# Patient Record
Sex: Female | Born: 1975 | Race: White | Hispanic: No | Marital: Married | Smoking: Never smoker
Health system: Southern US, Community
[De-identification: ages and names within clinical notes are randomized; demographics above are authoritative.]

## PROBLEM LIST (undated history)

## (undated) DIAGNOSIS — M8580 Other specified disorders of bone density and structure, unspecified site: Secondary | ICD-10-CM

## (undated) DIAGNOSIS — N200 Calculus of kidney: Secondary | ICD-10-CM

## (undated) HISTORY — DX: Calculus of kidney: N20.0

## (undated) HISTORY — PX: WISDOM TOOTH EXTRACTION: SHX21

## (undated) HISTORY — PX: LASIK: SHX215

---

## 2001-12-18 ENCOUNTER — Other Ambulatory Visit: Admission: RE | Admit: 2001-12-18 | Discharge: 2001-12-18 | Payer: Self-pay | Admitting: Obstetrics and Gynecology

## 2001-12-26 ENCOUNTER — Encounter: Admission: RE | Admit: 2001-12-26 | Discharge: 2001-12-26 | Payer: Self-pay | Admitting: Obstetrics and Gynecology

## 2001-12-26 ENCOUNTER — Encounter: Payer: Self-pay | Admitting: Obstetrics and Gynecology

## 2002-12-24 ENCOUNTER — Other Ambulatory Visit: Admission: RE | Admit: 2002-12-24 | Discharge: 2002-12-24 | Payer: Self-pay | Admitting: Obstetrics and Gynecology

## 2003-09-22 ENCOUNTER — Emergency Department (HOSPITAL_COMMUNITY): Admission: EM | Admit: 2003-09-22 | Discharge: 2003-09-22 | Payer: Self-pay | Admitting: Emergency Medicine

## 2003-12-29 ENCOUNTER — Other Ambulatory Visit: Admission: RE | Admit: 2003-12-29 | Discharge: 2003-12-29 | Payer: Self-pay | Admitting: Obstetrics and Gynecology

## 2004-01-19 ENCOUNTER — Ambulatory Visit (HOSPITAL_COMMUNITY): Admission: RE | Admit: 2004-01-19 | Discharge: 2004-01-19 | Payer: Self-pay | Admitting: Internal Medicine

## 2004-01-21 ENCOUNTER — Encounter: Admission: RE | Admit: 2004-01-21 | Discharge: 2004-01-21 | Payer: Self-pay | Admitting: Internal Medicine

## 2004-03-10 ENCOUNTER — Encounter: Admission: RE | Admit: 2004-03-10 | Discharge: 2004-03-10 | Payer: Self-pay | Admitting: Internal Medicine

## 2004-08-12 ENCOUNTER — Encounter: Admission: RE | Admit: 2004-08-12 | Discharge: 2004-08-12 | Payer: Self-pay | Admitting: Internal Medicine

## 2005-02-16 ENCOUNTER — Encounter: Admission: RE | Admit: 2005-02-16 | Discharge: 2005-02-16 | Payer: Self-pay | Admitting: Endocrinology

## 2005-10-10 ENCOUNTER — Encounter: Admission: RE | Admit: 2005-10-10 | Discharge: 2005-10-10 | Payer: Self-pay | Admitting: Internal Medicine

## 2005-12-28 ENCOUNTER — Encounter: Payer: Self-pay | Admitting: Family Medicine

## 2006-03-05 ENCOUNTER — Encounter: Admission: RE | Admit: 2006-03-05 | Discharge: 2006-03-05 | Payer: Self-pay | Admitting: Endocrinology

## 2007-03-01 ENCOUNTER — Encounter: Payer: Self-pay | Admitting: Family Medicine

## 2007-03-01 ENCOUNTER — Ambulatory Visit: Payer: Self-pay | Admitting: Family Medicine

## 2007-03-01 DIAGNOSIS — M899 Disorder of bone, unspecified: Secondary | ICD-10-CM

## 2007-03-01 DIAGNOSIS — M8580 Other specified disorders of bone density and structure, unspecified site: Secondary | ICD-10-CM | POA: Insufficient documentation

## 2007-03-01 DIAGNOSIS — M949 Disorder of cartilage, unspecified: Secondary | ICD-10-CM

## 2007-03-01 DIAGNOSIS — J309 Allergic rhinitis, unspecified: Secondary | ICD-10-CM | POA: Insufficient documentation

## 2007-03-01 DIAGNOSIS — Z87898 Personal history of other specified conditions: Secondary | ICD-10-CM | POA: Insufficient documentation

## 2007-07-19 ENCOUNTER — Encounter: Payer: Self-pay | Admitting: Family Medicine

## 2007-07-25 ENCOUNTER — Encounter: Payer: Self-pay | Admitting: Family Medicine

## 2007-07-30 ENCOUNTER — Encounter: Payer: Self-pay | Admitting: Family Medicine

## 2007-08-20 ENCOUNTER — Encounter: Payer: Self-pay | Admitting: Family Medicine

## 2007-09-09 ENCOUNTER — Encounter: Payer: Self-pay | Admitting: Family Medicine

## 2007-09-09 ENCOUNTER — Ambulatory Visit (HOSPITAL_COMMUNITY): Admission: RE | Admit: 2007-09-09 | Discharge: 2007-09-09 | Payer: Self-pay | Admitting: Specialist

## 2007-09-20 ENCOUNTER — Encounter: Payer: Self-pay | Admitting: Family Medicine

## 2008-12-26 ENCOUNTER — Inpatient Hospital Stay (HOSPITAL_COMMUNITY): Admission: AD | Admit: 2008-12-26 | Discharge: 2008-12-26 | Payer: Self-pay | Admitting: Obstetrics and Gynecology

## 2009-02-25 ENCOUNTER — Ambulatory Visit (HOSPITAL_COMMUNITY): Admission: RE | Admit: 2009-02-25 | Discharge: 2009-02-25 | Payer: Self-pay | Admitting: Obstetrics and Gynecology

## 2009-03-22 ENCOUNTER — Encounter: Payer: Self-pay | Admitting: Family Medicine

## 2009-07-23 ENCOUNTER — Inpatient Hospital Stay (HOSPITAL_COMMUNITY): Admission: AD | Admit: 2009-07-23 | Discharge: 2009-07-25 | Payer: Self-pay | Admitting: Obstetrics and Gynecology

## 2009-07-23 HISTORY — PX: OTHER SURGICAL HISTORY: SHX169

## 2009-07-29 ENCOUNTER — Ambulatory Visit: Admission: RE | Admit: 2009-07-29 | Discharge: 2009-07-29 | Payer: Self-pay | Admitting: Obstetrics and Gynecology

## 2009-08-13 ENCOUNTER — Ambulatory Visit: Admission: RE | Admit: 2009-08-13 | Discharge: 2009-08-13 | Payer: Self-pay | Admitting: Obstetrics and Gynecology

## 2010-12-18 ENCOUNTER — Encounter: Payer: Self-pay | Admitting: Obstetrics and Gynecology

## 2011-03-04 LAB — CBC
HCT: 38.6 % (ref 36.0–46.0)
Hemoglobin: 13 g/dL (ref 12.0–15.0)
RBC: 3.86 MIL/uL — ABNORMAL LOW (ref 3.87–5.11)
RDW: 13.3 % (ref 11.5–15.5)

## 2011-03-04 LAB — RPR: RPR Ser Ql: NONREACTIVE

## 2011-03-13 LAB — URINALYSIS, ROUTINE W REFLEX MICROSCOPIC
Glucose, UA: NEGATIVE mg/dL
Protein, ur: NEGATIVE mg/dL
Specific Gravity, Urine: <1.005 — ABNORMAL LOW (ref 1.005–1.030)
Urobilinogen, UA: 0.2 mg/dL (ref 0.0–1.0)

## 2011-03-24 ENCOUNTER — Other Ambulatory Visit: Payer: Self-pay | Admitting: Obstetrics and Gynecology

## 2011-03-24 ENCOUNTER — Inpatient Hospital Stay (HOSPITAL_COMMUNITY)
Admission: AD | Admit: 2011-03-24 | Discharge: 2011-03-24 | Disposition: A | Discharge status: Home / Self Care | Payer: 59 | Source: Ambulatory Visit | Attending: Obstetrics | Admitting: Obstetrics

## 2011-03-24 ENCOUNTER — Inpatient Hospital Stay (HOSPITAL_COMMUNITY): Payer: 59

## 2011-03-24 DIAGNOSIS — O039 Complete or unspecified spontaneous abortion without complication: Secondary | ICD-10-CM | POA: Insufficient documentation

## 2011-03-25 ENCOUNTER — Ambulatory Visit (HOSPITAL_COMMUNITY): Admission: RE | Admit: 2011-03-25 | Payer: 59 | Source: Ambulatory Visit | Admitting: Obstetrics and Gynecology

## 2011-03-28 DEATH — deceased

## 2011-04-07 ENCOUNTER — Encounter: Payer: Self-pay | Admitting: *Deleted

## 2011-04-11 ENCOUNTER — Ambulatory Visit (INDEPENDENT_AMBULATORY_CARE_PROVIDER_SITE_OTHER): Payer: 59 | Admitting: Family Medicine

## 2011-04-11 ENCOUNTER — Encounter: Payer: Self-pay | Admitting: Family Medicine

## 2011-04-11 DIAGNOSIS — M899 Disorder of bone, unspecified: Secondary | ICD-10-CM

## 2011-04-11 DIAGNOSIS — L719 Rosacea, unspecified: Secondary | ICD-10-CM

## 2011-04-11 DIAGNOSIS — R1900 Intra-abdominal and pelvic swelling, mass and lump, unspecified site: Secondary | ICD-10-CM

## 2011-04-11 DIAGNOSIS — Z13220 Encounter for screening for lipoid disorders: Secondary | ICD-10-CM

## 2011-04-11 DIAGNOSIS — M949 Disorder of cartilage, unspecified: Secondary | ICD-10-CM

## 2011-04-11 LAB — COMPREHENSIVE METABOLIC PANEL
ALT: 13 U/L (ref 0–35)
AST: 17 U/L (ref 0–37)
Alkaline Phosphatase: 51 U/L (ref 39–117)
BUN: 15 mg/dL (ref 6–23)
Calcium: 9.3 mg/dL (ref 8.4–10.5)
Chloride: 105 mEq/L (ref 96–112)
Creatinine, Ser: 0.6 mg/dL (ref 0.4–1.2)
Potassium: 4.7 mEq/L (ref 3.5–5.1)

## 2011-04-11 LAB — LIPID PANEL
HDL: 47.7 mg/dL (ref >39.00)
Total CHOL/HDL Ratio: 3
Triglycerides: 62 mg/dL (ref 0.0–149.0)
VLDL: 12.4 mg/dL (ref 0.0–40.0)

## 2011-04-11 MED ORDER — METRONIDAZOLE 1 % EX GEL: Freq: Every day | CUTANEOUS | Status: AC

## 2011-04-11 NOTE — Progress Notes (Signed)
  Subjective:    Patient ID: [name], female    DOB: [birth year], 35 y.o.   MRN: [id number]  HPI  35 year old female here to reestablish.  Hx/O hypercalceuria: w/up neg by endocrinologist, Dr. Lurene Shadow. 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 Was on HCTZ and calcium levels returned to normal and cleared from urine. She stopped HCTZ until done having kids. Has not seen Dr. Horald Pollen in  Has now had 20 month  Old. Not sure when last checked.  Osteopenia, stable: Last DXA done at Dr. Danella Penton office over 2 years ago.  Has noted since childbirth she has some patchy areas on face (cheecks and nose)...red, not itchy, dry and flaky. Red bumps on nose, occ slight pimple head. Wears mask at work, no new soaps etc. Mom has rosecea.   Review of Systems  Constitutional: Negative for fever, fatigue and unexpected weight change.  HENT: Negative for ear pain.   Eyes: Negative for pain.  Respiratory: Negative for shortness of breath.   Cardiovascular: Negative for chest pain, palpitations and leg swelling.  Gastrointestinal: Positive for abdominal pain. Negative for nausea, diarrhea, constipation and blood in stool.       Ever since delivery has noted swelling pressing forward in right flank..If moves this disappears. Soreness in this area. Has gradually improved over time. Discussed with GYN.Marland Kitchenminmal concerns, nml pelvic, [place] etc.       Objective:   Physical Exam  Constitutional: Vital signs are normal. She appears well-developed and well-nourished. She is cooperative.  Non-toxic appearance. She does not appear ill. No distress.  HENT:  Head: Normocephalic.  Right Ear: Hearing, tympanic membrane, external ear and ear canal normal. Tympanic membrane is not erythematous, not retracted and not bulging.  Left Ear: Hearing, tympanic membrane, external ear and ear canal normal. Tympanic membrane is not erythematous, not retracted and not bulging.  Nose: No mucosal edema or rhinorrhea. Right sinus  exhibits no maxillary sinus tenderness and no frontal sinus tenderness. Left sinus exhibits no maxillary sinus tenderness and no frontal sinus tenderness.  Mouth/Throat: Uvula is midline, oropharynx is clear and moist and mucous membranes are normal.  Eyes: Conjunctivae, EOM and lids are normal. Pupils are equal, round, and reactive to light. No foreign bodies found.  Neck: Trachea normal and normal range of motion. Neck supple. Carotid bruit is not present. No mass and no thyromegaly present.  Cardiovascular: Normal rate, regular rhythm, S1 normal, S2 normal, normal heart sounds, intact distal pulses and normal pulses.  Exam reveals no gallop and no friction rub.   No murmur heard. Pulmonary/Chest: Effort normal and breath sounds normal. Not tachypneic. No respiratory distress. She has no decreased breath sounds. She has no wheezes. She has no rhonchi. She has no rales.  Abdominal: Soft. Normal appearance and bowel sounds are normal. There is no hepatosplenomegaly. There is no tenderness. No hernia.       No masses  Neurological: She is alert.  Skin: Skin is warm, dry and intact. No rash noted.  Psychiatric: Her speech is normal and behavior is normal. Judgment and thought content normal. Her mood appears not anxious. Cognition and memory are normal. She does not exhibit a depressed mood.          Assessment & Plan:

## 2011-04-11 NOTE — Assessment & Plan Note (Signed)
Start metro gel. If not improving. Gi to Derm for reeval.

## 2011-04-11 NOTE — Assessment & Plan Note (Signed)
Pt very thin...most likely she is feeling colon push forward..notes it more prominent after she eats, then disappears with movement. No red flags, no indications for imaging. If pt develops red flags (reviewed with pt) increasing pain or notes are more often consider further eval.  Pelvic US at GYN wnl.

## 2011-04-11 NOTE — Assessment & Plan Note (Signed)
Due for re-eval. 

## 2011-04-11 NOTE — Patient Instructions (Addendum)
Check with work about when last tetanus done. Work on regular exercise an increasing water in diet. Start metro gel for facial rash. If not improving return to [facility] for further eval. We will call with lab results. If abdominal swelling becoming more painful or noted more  frequently or if weight loss, night sweats, etc....call.

## 2011-04-11 NOTE — Assessment & Plan Note (Signed)
Due for DEXA. 

## 2011-04-11 NOTE — H&P (Signed)
[name], [name] NO.:  [id number]   MEDICAL RECORD NO.:  [id number]          PATIENT TYPE:  MAT   LOCATION:  [facility]                          FACILITY:  [facility]   PHYSICIAN:  Lenoard Aden, M.D.DATE OF BIRTH:  1975/12/03   DATE OF ADMISSION:  12/26/2008  DATE OF DISCHARGE:  12/26/2008                              HISTORY & PHYSICAL   CHIEF COMPLAINT:  Nausea, vomiting at [redacted] weeks gestation.   She is a 35 year old white female G1, P0 at 8 plus weeks gestation based  on positive SPT with nausea and vomiting, on Zofran, history of  constipation with suppository use.   PAST MEDICAL HISTORY:  Remarkable for chicken pox, kidney stones, and  migraine headaches.   ALLERGIES:  SULFA and ZITHROMAX.   SOCIAL HISTORY:  She is a nonsmoker, nondrinker.  Denies domestic or  physical violence.   FAMILY HISTORY:  Diabetes, heart disease, myocardial infarction,  hypertension, lung cancer, stroke, kidney disease, COPD, and anemia.  Prenatal course otherwise uncomplicated.   PHYSICAL EXAMINATION:  GENERAL:  She is a well-developed, well-nourished  Caucasian female.  VITAL SIGNS:  Weight of 115 pounds, height 5 feet 7 inches.  HEENT:  Normal.  LUNGS:  Clear.  HEART:  Regular rhythm.  ABDOMEN:  Soft, nontender.  PELVIC:  Deferred.  EXTREMITIES:  There are no cords.  NEUROLOGIC:  Nonfocal.  SKIN:  Intact.   IMPRESSION:  Hyperemesis in pregnancy.   PLAN:  IV fluid hydration, Zofran IV, Phenergan IV, Enema as needed.   Follow up in the office as scheduled.  The patient has Zofran and  Phenergan at home.  She will be discharged to home, pending correction  of her dehydration and IV fluids.      Lenoard Aden, M.D.  Electronically Signed    RJT/MEDQ  D:  12/26/2008  T:  12/27/2008  Job:  [id number]

## 2011-04-12 ENCOUNTER — Telehealth: Payer: Self-pay | Admitting: *Deleted

## 2011-04-12 LAB — VITAMIN D 25 HYDROXY (VIT D DEFICIENCY, FRACTURES): Vit D, 25-Hydroxy: 36 ng/mL (ref 30–89)

## 2011-04-12 NOTE — Telephone Encounter (Signed)
Please enter in EMR.

## 2011-04-12 NOTE — Telephone Encounter (Signed)
Patient called to let you know that her last tetanus was done on 02-13-02.

## 2011-04-14 NOTE — Telephone Encounter (Signed)
Updated in epic.

## 2011-04-20 ENCOUNTER — Ambulatory Visit
Admission: RE | Admit: 2011-04-20 | Discharge: 2011-04-20 | Disposition: A | Discharge status: Home / Self Care | Payer: 59 | Source: Ambulatory Visit | Attending: Family Medicine | Admitting: Family Medicine

## 2011-04-20 DIAGNOSIS — M949 Disorder of cartilage, unspecified: Secondary | ICD-10-CM

## 2011-11-28 NOTE — L&D Delivery Note (Signed)
Delivery Note At 10:46 AM a viable and healthy female was delivered via Vaginal, Spontaneous Delivery (Presentation: Right Occiput Anterior).  APGAR: 9, 9; weight 6 lb 7.7 oz (2940 g).   Placenta status: Intact, Spontaneous.  Cord: 3 vessels with the following complications: None.  Cord pH: none  Anesthesia: Epidural  Episiotomy: Median Lacerations: None Suture Repair: 3.0 chromic vicryl rapide Est. Blood Loss (mL): 300  Mom to postpartum.  Baby to nursery-stable.  , A 06/21/2012, 7:49 PM

## 2011-11-30 LAB — OB RESULTS CONSOLE GC/CHLAMYDIA: Gonorrhea: NEGATIVE

## 2011-11-30 LAB — OB RESULTS CONSOLE HEPATITIS B SURFACE ANTIGEN: Hepatitis B Surface Ag: NEGATIVE

## 2011-11-30 LAB — OB RESULTS CONSOLE HIV ANTIBODY (ROUTINE TESTING): HIV: NONREACTIVE

## 2011-11-30 LAB — OB RESULTS CONSOLE ABO/RH

## 2011-11-30 LAB — OB RESULTS CONSOLE RUBELLA ANTIBODY, IGM: Rubella: IMMUNE

## 2011-11-30 LAB — OB RESULTS CONSOLE ANTIBODY SCREEN: Antibody Screen: NEGATIVE

## 2012-02-08 ENCOUNTER — Ambulatory Visit: Payer: 59

## 2012-02-08 ENCOUNTER — Other Ambulatory Visit: Payer: 59 | Admitting: Lab

## 2012-05-28 LAB — OB RESULTS CONSOLE GBS: GBS: NEGATIVE

## 2012-06-19 LAB — POCT UA - GLUCOSE/PROTEIN

## 2012-06-21 ENCOUNTER — Encounter (HOSPITAL_COMMUNITY): Payer: Self-pay | Admitting: Anesthesiology

## 2012-06-21 ENCOUNTER — Inpatient Hospital Stay (HOSPITAL_COMMUNITY)
Admission: AD | Admit: 2012-06-21 | Discharge: 2012-06-23 | DRG: 775 | Disposition: A | Discharge status: Home / Self Care | Payer: 59 | Source: Ambulatory Visit | Attending: Obstetrics and Gynecology | Admitting: Obstetrics and Gynecology

## 2012-06-21 ENCOUNTER — Inpatient Hospital Stay (HOSPITAL_COMMUNITY): Payer: 59 | Admitting: Anesthesiology

## 2012-06-21 ENCOUNTER — Encounter (HOSPITAL_COMMUNITY): Payer: Self-pay | Admitting: *Deleted

## 2012-06-21 DIAGNOSIS — O09519 Supervision of elderly primigravida, unspecified trimester: Principal | ICD-10-CM | POA: Diagnosis present

## 2012-06-21 HISTORY — DX: Hypercalcemia: E83.52

## 2012-06-21 HISTORY — DX: Other specified disorders of bone density and structure, unspecified site: M85.80

## 2012-06-21 LAB — CBC
MCH: 32.5 pg (ref 26.0–34.0)
Platelets: 166 10*3/uL (ref 150–400)
RBC: 4.06 MIL/uL (ref 3.87–5.11)

## 2012-06-21 LAB — URIC ACID: Uric Acid, Serum: 5 mg/dL (ref 2.4–7.0)

## 2012-06-21 MED ORDER — FENTANYL 2.5 MCG/ML BUPIVACAINE 1/10 % EPIDURAL INFUSION (WH - ANES)
14.0000 mL/h | INTRAMUSCULAR | Status: DC
  Administered 2012-06-21 (×2): 14 mL/h via EPIDURAL
  Filled 2012-06-21: qty 60

## 2012-06-21 MED ORDER — PHENYLEPHRINE 40 MCG/ML (10ML) SYRINGE FOR IV PUSH (FOR BLOOD PRESSURE SUPPORT): 80.0000 ug | PREFILLED_SYRINGE | INTRAVENOUS | Status: DC | PRN

## 2012-06-21 MED ORDER — IBUPROFEN 600 MG PO TABS
600.0000 mg | ORAL_TABLET | Freq: Four times a day (QID) | ORAL | Status: DC
  Administered 2012-06-21 – 2012-06-23 (×8): 600 mg via ORAL
  Filled 2012-06-21 (×8): qty 1

## 2012-06-21 MED ORDER — CITRIC ACID-SODIUM CITRATE 334-500 MG/5ML PO SOLN: 30.0000 mL | ORAL | Status: DC | PRN

## 2012-06-21 MED ORDER — DIBUCAINE 1 % RE OINT: 1.0000 "application " | TOPICAL_OINTMENT | RECTAL | Status: DC | PRN

## 2012-06-21 MED ORDER — SENNOSIDES-DOCUSATE SODIUM 8.6-50 MG PO TABS
2.0000 | ORAL_TABLET | Freq: Every day | ORAL | Status: DC
  Administered 2012-06-21 – 2012-06-22 (×2): 2 via ORAL

## 2012-06-21 MED ORDER — DIPHENHYDRAMINE HCL 25 MG PO CAPS: 25.0000 mg | ORAL_CAPSULE | Freq: Four times a day (QID) | ORAL | Status: DC | PRN

## 2012-06-21 MED ORDER — ONDANSETRON HCL 4 MG PO TABS: 4.0000 mg | ORAL_TABLET | ORAL | Status: DC | PRN

## 2012-06-21 MED ORDER — LACTATED RINGERS IV SOLN: INTRAVENOUS | Status: DC

## 2012-06-21 MED ORDER — EPHEDRINE 5 MG/ML INJ
10.0000 mg | INTRAVENOUS | Status: DC | PRN
  Filled 2012-06-21: qty 4

## 2012-06-21 MED ORDER — OXYCODONE-ACETAMINOPHEN 5-325 MG PO TABS: 1.0000 | ORAL_TABLET | ORAL | Status: DC | PRN

## 2012-06-21 MED ORDER — IBUPROFEN 600 MG PO TABS: 600.0000 mg | ORAL_TABLET | Freq: Four times a day (QID) | ORAL | Status: DC | PRN

## 2012-06-21 MED ORDER — LACTATED RINGERS IV SOLN: 500.0000 mL | Freq: Once | INTRAVENOUS | Status: DC

## 2012-06-21 MED ORDER — WITCH HAZEL-GLYCERIN EX PADS: 1.0000 "application " | MEDICATED_PAD | CUTANEOUS | Status: DC | PRN

## 2012-06-21 MED ORDER — SIMETHICONE 80 MG PO CHEW: 80.0000 mg | CHEWABLE_TABLET | ORAL | Status: DC | PRN

## 2012-06-21 MED ORDER — ACETAMINOPHEN 325 MG PO TABS: 650.0000 mg | ORAL_TABLET | ORAL | Status: DC | PRN

## 2012-06-21 MED ORDER — LACTATED RINGERS IV SOLN: 500.0000 mL | INTRAVENOUS | Status: DC | PRN

## 2012-06-21 MED ORDER — ONDANSETRON HCL 4 MG/2ML IJ SOLN: 4.0000 mg | Freq: Four times a day (QID) | INTRAMUSCULAR | Status: DC | PRN

## 2012-06-21 MED ORDER — EPHEDRINE 5 MG/ML INJ: 10.0000 mg | INTRAVENOUS | Status: DC | PRN

## 2012-06-21 MED ORDER — LIDOCAINE HCL (PF) 1 % IJ SOLN
30.0000 mL | INTRAMUSCULAR | Status: DC | PRN
  Filled 2012-06-21: qty 30

## 2012-06-21 MED ORDER — OXYTOCIN BOLUS FROM INFUSION
250.0000 mL | Freq: Once | INTRAVENOUS | Status: AC
  Administered 2012-06-21: 250 mL via INTRAVENOUS
  Filled 2012-06-21: qty 500

## 2012-06-21 MED ORDER — ZOLPIDEM TARTRATE 5 MG PO TABS: 5.0000 mg | ORAL_TABLET | Freq: Every evening | ORAL | Status: DC | PRN

## 2012-06-21 MED ORDER — TERBUTALINE SULFATE 1 MG/ML IJ SOLN: 0.2500 mg | Freq: Once | INTRAMUSCULAR | Status: DC | PRN

## 2012-06-21 MED ORDER — ONDANSETRON HCL 4 MG/2ML IJ SOLN: 4.0000 mg | INTRAMUSCULAR | Status: DC | PRN

## 2012-06-21 MED ORDER — LIDOCAINE HCL (PF) 1 % IJ SOLN
INTRAMUSCULAR | Status: DC | PRN
  Administered 2012-06-21 (×3): 4 mL

## 2012-06-21 MED ORDER — OXYTOCIN 40 UNITS IN LACTATED RINGERS INFUSION - SIMPLE MED
1.0000 m[IU]/min | INTRAVENOUS | Status: DC
  Filled 2012-06-21: qty 1000

## 2012-06-21 MED ORDER — PRENATAL MULTIVITAMIN CH
1.0000 | ORAL_TABLET | Freq: Every day | ORAL | Status: DC
  Filled 2012-06-21 (×2): qty 1

## 2012-06-21 MED ORDER — LANOLIN HYDROUS EX OINT: TOPICAL_OINTMENT | CUTANEOUS | Status: DC | PRN

## 2012-06-21 MED ORDER — DIPHENHYDRAMINE HCL 50 MG/ML IJ SOLN: 12.5000 mg | INTRAMUSCULAR | Status: DC | PRN

## 2012-06-21 MED ORDER — BENZOCAINE-MENTHOL 20-0.5 % EX AERO
1.0000 "application " | INHALATION_SPRAY | CUTANEOUS | Status: DC | PRN
  Filled 2012-06-21: qty 56

## 2012-06-21 MED ORDER — PHENYLEPHRINE 40 MCG/ML (10ML) SYRINGE FOR IV PUSH (FOR BLOOD PRESSURE SUPPORT)
80.0000 ug | PREFILLED_SYRINGE | INTRAVENOUS | Status: DC | PRN
  Filled 2012-06-21: qty 5

## 2012-06-21 MED ORDER — OXYTOCIN 10 UNIT/ML IJ SOLN: 10.0000 [IU] | Freq: Once | INTRAMUSCULAR | Status: DC

## 2012-06-21 MED ORDER — OXYTOCIN 40 UNITS IN LACTATED RINGERS INFUSION - SIMPLE MED: 62.5000 mL/h | Freq: Once | INTRAVENOUS | Status: DC

## 2012-06-21 NOTE — MAU Note (Signed)
Pt states U/C's started about 0520 this morning and have become more intense and closer together.  Denies any vaginal bleeding or ROM.  Good fetal movement.

## 2012-06-21 NOTE — Anesthesia Postprocedure Evaluation (Signed)
  Anesthesia Post-op Note  Patient: [name]  Procedure(s) Performed: * No procedures listed *  Patient Location: Mother/Baby  Anesthesia Type: Epidural  Level of Consciousness: awake  Airway and Oxygen Therapy: Patient Spontanous Breathing  Post-op Pain: mild  Post-op Assessment: Patient's Cardiovascular Status Stable and Respiratory Function Stable  Post-op Vital Signs: stable  Complications: No apparent anesthesia complications

## 2012-06-21 NOTE — Anesthesia Preprocedure Evaluation (Signed)
Anesthesia Evaluation  Patient identified by MRN, date of birth, ID band Patient awake    Reviewed: Allergy & Precautions, H&P , NPO status , Patient's Chart, lab work & pertinent test results, reviewed documented beta blocker date and time   History of Anesthesia Complications Negative for: history of anesthetic complications  Airway Mallampati: I TM Distance: >3 FB Neck ROM: full    Dental  (+) Teeth Intact   Pulmonary Recent URI  (currently has a cold),  breath sounds clear to auscultation        Cardiovascular negative cardio ROS  Rhythm:regular Rate:Normal     Neuro/Psych negative neurological ROS  negative psych ROS   GI/Hepatic negative GI ROS, Neg liver ROS,   Endo/Other  negative endocrine ROS  Renal/GU negative Renal ROS     Musculoskeletal   Abdominal   Peds  Hematology negative hematology ROS (+)   Anesthesia Other Findings   Reproductive/Obstetrics (+) Pregnancy                           Anesthesia Physical Anesthesia Plan  ASA: II  Anesthesia Plan: Epidural   Post-op Pain Management:    Induction:   Airway Management Planned:   Additional Equipment:   Intra-op Plan:   Post-operative Plan:   Informed Consent: I have reviewed the patients History and Physical, chart, labs and discussed the procedure including the risks, benefits and alternatives for the proposed anesthesia with the patient or authorized representative who has indicated his/her understanding and acceptance.     Plan Discussed with:   Anesthesia Plan Comments:         Anesthesia Quick Evaluation

## 2012-06-21 NOTE — H&P (Signed)
[name] is a 36 y.o. female  [id number] MWF now @ 38 weeks presenting for active labor. Intact membrane Maternal Medical History:  Reason for admission: Reason for admission: contractions.  Contractions: Onset was 3-5 hours ago.   Frequency: regular.   Perceived severity is moderate.    Fetal activity: Perceived fetal activity is normal.    Prenatal complications: no prenatal complications Prenatal Complications - Diabetes: none.    OB History    Grav Para Term Preterm Abortions TAB SAB Ect Mult Living   1             NSVD x 1 No past medical history on file. Past Surgical History  Procedure Date  . Lasik   . Nsvd  07/23/2009   Family History: family history includes Cancer in her maternal grandfather; Diabetes in her maternal grandmother and maternal uncle; Heart attack in her father; Kidney disease in her maternal grandmother and maternal uncle; and Osteoporosis in her paternal grandmother. Social History:  reports that she has never smoked. She does not have any smokeless tobacco history on file. She reports that she does not drink alcohol or use illicit drugs.   Prenatal Transfer Tool  Maternal Diabetes: No Genetic Screening: Declined Maternal Ultrasounds/Referrals: Normal Fetal Ultrasounds or other Referrals:  None Maternal Substance Abuse:  No Significant Maternal Medications:  None Significant Maternal Lab Results:  Lab values include: Group B Strep negative Other Comments:  None  ROS  Dilation: 4.5 Effacement (%): 80 Station: -1 Exam by:: Dr. Cherly Hensen There were no vitals taken for this visit. Maternal Exam:  Uterine Assessment: Contraction strength is moderate.  Contraction frequency is regular.   Abdomen: Patient reports no abdominal tenderness. Fetal presentation: vertex  Introitus: Ferning test: not done.   Pelvis: adequate for delivery.   Cervix: Cervix evaluated by digital exam.     Physical Exam  Constitutional: She is oriented to person,  place, and time. She appears well-developed and well-nourished.  HENT:  Head: Atraumatic.  Eyes: Pupils are equal, round, and reactive to light.  Neck: Normal range of motion.  Cardiovascular: Regular rhythm.   Respiratory: Breath sounds normal.  GI: Soft.  Musculoskeletal: She exhibits no edema.  Neurological: She is alert and oriented to person, place, and time.  Skin: Skin is warm and dry.  Psychiatric: She has a normal mood and affect.   VE: 4/80/-2 intact  Prenatal labs: ABO, Rh:  A positive Antibody:  neg Rubella:  Immune RPR:   NR HBsAg:   neg HIV:   neg GBS:   neg  Assessment/Plan: Term gestation  Active labor  P)admit  Routine labs. Epidural,. Pitocin prn , A 06/21/2012, 8:24 AM

## 2012-06-21 NOTE — Anesthesia Procedure Notes (Signed)
Epidural Patient location during procedure: OB Start time: 06/21/2012 9:49 AM Reason for block: procedure for pain  Staffing Performed by: anesthesiologist   Preanesthetic Checklist Completed: patient identified, site marked, surgical consent, pre-op evaluation, timeout performed, IV checked, risks and benefits discussed and monitors and equipment checked  Epidural Patient position: sitting Prep: site prepped and draped and DuraPrep Patient monitoring: continuous pulse ox and blood pressure Approach: midline Injection technique: LOR air  Needle:  Needle type: Tuohy  Needle gauge: 17 G Needle length: 9 cm Catheter type: closed end flexible Catheter size: 19 Gauge Test dose: negative  Assessment Events: blood not aspirated, injection not painful, no injection resistance, negative IV test and no paresthesia  Additional Notes Discussed risk of headache, infection, bleeding, nerve injury and failed or incomplete block.  Patient voices understanding and wishes to proceed.

## 2012-06-22 ENCOUNTER — Encounter (HOSPITAL_COMMUNITY): Payer: Self-pay

## 2012-06-22 LAB — CBC
HCT: 33.1 % — ABNORMAL LOW (ref 36.0–46.0)
Hemoglobin: 11.2 g/dL — ABNORMAL LOW (ref 12.0–15.0)
RBC: 3.43 MIL/uL — ABNORMAL LOW (ref 3.87–5.11)

## 2012-06-22 LAB — RPR: RPR Ser Ql: NONREACTIVE

## 2012-06-22 NOTE — Progress Notes (Signed)
PPD 1 SVD  S:  Reports feeling well.             Tolerating po/ No nausea or vomiting             Bleeding is light             Pain controlled with Motrin.             Up ad lib / ambulatory / voiding well.   Newborn  Information for the patient's newborn:  Kindall, Swaby [[id number]]  female  breast feeding, previous issues w/ supply, discussed galactogogue - Moringa  / Circumcision done   O:  A & O x 3 NAD             VS:  Filed Vitals:   06/21/12 1305 06/21/12 1410 06/21/12 1810 06/22/12 0200  BP: 101/67 104/67 109/64 116/69  Pulse: 76 106 95 76  Temp: 98.2 F (36.8 C) 98.8 F (37.1 C) 98.2 F (36.8 C) 98 F (36.7 C)  TempSrc: Oral Oral Oral Oral  Resp: 16 16 18 18   Height:      Weight:      SpO2:        LABS:  Basename 06/22/12 0500 06/21/12 0915  WBC 8.6 10.9*  HGB 11.2* 13.2  HCT 33.1* 39.1  PLT 143* 166    Blood type: A/Positive/-- (01/03 0000)  Rubella: Immune (01/03 0000)   I&O: I/O last 3 completed shifts: In: -  Out: 600 [Blood:600]      Lungs: Clear and unlabored  Heart: regular rate and rhythm / no murmurs  Abdomen: soft, non-tender, non-distended              Fundus: firm, non-tender, U -3  Perineum: intact, no edema  Lochia: scant  Extremities: no edema, no calf pain or tenderness, neg Homans    A/P: PPD # 1 36 y.o., [id number]    Principal Problem:  *Postpartum care following vaginal delivery (7/26)   Doing well - stable status  LC support today  Routine post partum orders  Anticipate discharge home in AM.   PAUL,DANIELA, CNM, MSN 06/22/2012, 10:01 AM

## 2012-06-23 MED ORDER — IBUPROFEN 600 MG PO TABS: 600.0000 mg | ORAL_TABLET | Freq: Four times a day (QID) | ORAL | Status: AC

## 2012-06-23 NOTE — Discharge Summary (Signed)
Obstetric Discharge Summary Reason for Admission: onset of labor Prenatal Procedures: ultrasound Intrapartum Procedures: spontaneous vaginal delivery Postpartum Procedures: none Complications-Operative and Postpartum: none Hemoglobin  Date Value Range Status  06/22/2012 11.2* 12.0 - 15.0 g/dL Final     HCT  Date Value Range Status  06/22/2012 33.1* 36.0 - 46.0 % Final    Physical Exam:  General: alert, cooperative and no distress Lochia: appropriate Uterine Fundus: firm Incision: n/a, perineum intact DVT Evaluation: Negative Homan's sign. No significant calf/ankle edema.  Discharge Diagnoses: Term Pregnancy-delivered  Discharge Information: Date: 06/23/2012 Activity: pelvic rest Diet: routine Medications: PNV and Ibuprofen Condition: stable Instructions: refer to practice specific booklet Discharge to: home Follow-up Information    Follow up with COUSINS,SHERONETTE A, MD. Schedule an appointment as soon as possible for a visit in 6 weeks.   Contact information:   [address] [place] [address] [phone]          Newborn Data: Live born female "Montez Morita" Birth Weight: 6 lb 7.7 oz (2940 g) APGAR: 9, 9  Home with mother.  PAUL,DANIELA 06/23/2012, 9:53 AM

## 2012-06-23 NOTE — Progress Notes (Signed)
Post Partum Day #2            Information for the patient's newborn:  Aanya, Haynes [[id number]]  female   / circumcision done Feeding: breast, baby doing well on breast, milk coming in.  Subjective: No HA, SOB, CP, F/C, breast symptoms. Pain minimal. Normal vaginal bleeding, no clots.      Objective:  Temp:  [98 F (36.7 C)-98.6 F (37 C)] 98 F (36.7 C) (07/28 0600) Pulse Rate:  [86-96] 89  (07/28 0600) Resp:  [18] 18  (07/28 0600) BP: (95-109)/(66-67) 95/67 mmHg (07/28 0600)  No intake or output data in the 24 hours ending 06/23/12 0944     Basename 06/22/12 0500 06/21/12 0915  WBC 8.6 10.9*  HGB 11.2* 13.2  HCT 33.1* 39.1  PLT 143* 166    Blood type: A/Positive/-- (01/03 0000) Rubella: Immune (01/03 0000)    Physical Exam:  General: alert, cooperative and no distress Uterine Fundus: firm Lochia: appropriate Perineum: intact, edema none DVT Evaluation: Negative Homan's sign. No significant calf/ankle edema.    Assessment/Plan: PPD # 2 / 36 y.o., [id number]   Principal Problem:  *Postpartum care following vaginal delivery (7/26)    normal postpartum exam  Continue current postpartum care  D/C home   LOS: 2 days   ,, CNM, MSN 06/23/2012, 9:44 AM

## 2012-06-24 ENCOUNTER — Encounter (HOSPITAL_COMMUNITY): Payer: Self-pay | Admitting: *Deleted

## 2012-08-02 ENCOUNTER — Encounter (HOSPITAL_BASED_OUTPATIENT_CLINIC_OR_DEPARTMENT_OTHER): Payer: Self-pay | Admitting: *Deleted

## 2012-08-02 ENCOUNTER — Emergency Department (HOSPITAL_BASED_OUTPATIENT_CLINIC_OR_DEPARTMENT_OTHER)
Admission: EM | Admit: 2012-08-02 | Discharge: 2012-08-02 | Disposition: A | Discharge status: Home / Self Care | Payer: 59 | Attending: Emergency Medicine | Admitting: Emergency Medicine

## 2012-08-02 DIAGNOSIS — R109 Unspecified abdominal pain: Secondary | ICD-10-CM | POA: Insufficient documentation

## 2012-08-02 DIAGNOSIS — Z87442 Personal history of urinary calculi: Secondary | ICD-10-CM | POA: Insufficient documentation

## 2012-08-02 DIAGNOSIS — N23 Unspecified renal colic: Secondary | ICD-10-CM

## 2012-08-02 DIAGNOSIS — Z882 Allergy status to sulfonamides status: Secondary | ICD-10-CM | POA: Insufficient documentation

## 2012-08-02 DIAGNOSIS — R319 Hematuria, unspecified: Secondary | ICD-10-CM | POA: Insufficient documentation

## 2012-08-02 LAB — URINE MICROSCOPIC-ADD ON

## 2012-08-02 LAB — URINALYSIS, ROUTINE W REFLEX MICROSCOPIC
Glucose, UA: NEGATIVE mg/dL
Leukocytes, UA: NEGATIVE
pH: 6 (ref 5.0–8.0)

## 2012-08-02 NOTE — ED Notes (Signed)
Pt reports relief from left flank pain upon urination.  There is a visible stone in her urine.

## 2012-08-02 NOTE — ED Provider Notes (Signed)
History     CSN: [id number]  Arrival date & time 08/02/12  5784   First MD Initiated Contact with Patient 08/02/12 0710      Chief Complaint  Patient presents with  . Flank Pain  . Urinary Frequency    (Consider location/radiation/quality/duration/timing/severity/associated sxs/prior treatment) HPI [name] is a 36 y.o. female presenting with "kidney stone."  Patient started having symptoms a few days ago, and was able to see her obstetrician gynecologist yesterday the touch and a bladder infection and was started on Keflex. Patient's pain was all low and seemed like "bladder spasms", however she was awoken this morning by severe left-sided 10/10 deep aching in the left flank. This is also associated with some urgency, frequency dysuria.  Patient felt hot last night but feels better now. She thinks she is passed whatever stone she had. 6 weeks postpartum and is breast-feeding. She's not been sexually active with her husband. The patient denies any vaginal discharge, vaginal odor, breast pain or redness, denies any abdominal pain. Patient is pain-free at this time.   Past Medical History  Diagnosis Date  . Osteopenia   . Hypercalcemia   . Postpartum care following vaginal delivery (7/26) 06/22/2012    Past Surgical History  Procedure Date  . Lasik   . Nsvd  07/23/2009  . Wisdom tooth extraction     Family History  Problem Relation Age of Onset  . Heart attack Father   . Diabetes Maternal Grandmother   . Kidney disease Maternal Grandmother   . Cancer Maternal Grandfather     lung  . Osteoporosis Paternal Grandmother   . Diabetes Maternal Uncle   . Kidney disease Maternal Uncle     History  Substance Use Topics  . Smoking status: Never Smoker   . Smokeless tobacco: Never Used  . Alcohol Use: No    OB History    Grav Para Term Preterm Abortions TAB SAB Ect Mult Living   3 2 2  1  1   2       Review of Systems Feeling hot, chills, urgency, frequency, flank pain  ;patient denies any fevers or chills, changes in vision, earache, sore throat, neck pain or stiffness, chest pain or pressure, palpitations, syncope, dyspnea, cough, wheezing, nausea, vomiting, diarrhea, melena, red bloody stools, myalgias, arthralgias, recent trauma, rash, itching, skin lesions, easy bruising or bleeding, headache, seizures, numbness, tingling or weakness.    Allergies  Azithromycin and Sulfa antibiotics  Home Medications   Current Outpatient Rx  Name Route Sig Dispense Refill  . DOCUSATE SODIUM 100 MG PO CAPS Oral Take 100 mg by mouth daily as needed. Stool softener.    . GUAIFENESIN 100 MG/5ML PO LIQD Oral Take 200 mg by mouth 3 (three) times daily as needed. For congestion.    Marland Kitchen LORATADINE 10 MG PO TABS Oral Take 10 mg by mouth daily as needed. For allergy.    . MULTI-VITAMIN/MINERALS PO TABS Oral Take 1 tablet by mouth daily.      BP 123/77  Pulse 86  Temp 97.9 F (36.6 C) (Oral)  Resp 16  Ht 5\' 6"  (1.676 m)  Wt 125 lb (56.7 kg)  BMI 20.18 kg/m2  SpO2 100%  Breastfeeding? Unknown  Physical Exam VITAL SIGNS:   Filed Vitals:   08/02/12 0654  BP: 123/77  Pulse: 86  Temp: 97.9 F (36.6 C)  Resp: 16   CONSTITUTIONAL: Awake, oriented, appears non-toxic HENT: Atraumatic, normocephalic, oral mucosa pink and moist, airway patent. Nares  patent without drainage. External ears normal. EYES: Conjunctiva clear, EOMI, PERRLA NECK: Trachea midline, non-tender, supple CARDIOVASCULAR: Normal heart rate, Normal rhythm, No murmurs, rubs, gallops Breast: Deferred - patient denies any redness or tenderness PULMONARY/CHEST: Clear to auscultation, no rhonchi, wheezes, or rales. Symmetrical breath sounds. Non-tender. No CVA tenderness to percussion. ABDOMINAL: Non-distended, soft, non-tender - no rebound or guarding.  BS normal. NEUROLOGIC: Non-focal, moving all four extremities, no gross sensory or motor deficits. EXTREMITIES: No clubbing, cyanosis, or edema SKIN: Warm,  Dry, No erythema, No rash  ED Course  Procedures (including critical care time)   Labs Reviewed  URINALYSIS, ROUTINE W REFLEX MICROSCOPIC   No results found.   No diagnosis found.    MDM  NELEH MULDOON is a 36 y.o. female Presenting with likely renal colic and possible passed kidney stone. Patient saw her obstetrician gynecologist yesterday, was put on Keflex and urine was cultured. I do not have access to that laboratory data however the patient says her symptoms have just about resolved. Urinalysis obtained here shows red cells consistent with ureteral colic, and 0-2 white cells. Urinalysis is certainly not consistent with pyelonephritis even after a few doses of Keflex.  Patient has no CVA tenderness on clinical exam. Discussed risks and benefits with starting a different antibiotic, third-generation cephalosporin, but the patient is willing to continue taking the Keflex and to return to the emergency department or call her obstetrician if symptoms return. Patient is a Teacher, early years/pre, she is a reliable historian and I believe a reliable patient and she will followup as needed.  This time my suspicion for pyelonephritis low. She's likely passed a stone.  We'll discharge patient home stable and in good condition.          Jones Skene, MD 08/02/12 [phone]

## 2012-08-02 NOTE — ED Notes (Signed)
Pt c/o left flank pain this am which has now subsided. Pt was seen at [facility] yesterday and dx'd with bladder infection and started on Keflex. Pt states that she continues to have urinary frequency and bladder spasms.

## 2013-11-26 ENCOUNTER — Other Ambulatory Visit: Payer: Self-pay | Admitting: Obstetrics and Gynecology

## 2013-11-26 DIAGNOSIS — N6459 Other signs and symptoms in breast: Secondary | ICD-10-CM

## 2013-11-28 ENCOUNTER — Other Ambulatory Visit: Payer: Self-pay | Admitting: Obstetrics and Gynecology

## 2013-11-28 DIAGNOSIS — N6459 Other signs and symptoms in breast: Secondary | ICD-10-CM

## 2013-12-05 ENCOUNTER — Ambulatory Visit
Admission: RE | Admit: 2013-12-05 | Discharge: 2013-12-05 | Disposition: A | Discharge status: Home / Self Care | Payer: 59 | Source: Ambulatory Visit | Attending: Obstetrics and Gynecology | Admitting: Obstetrics and Gynecology

## 2013-12-05 DIAGNOSIS — N6459 Other signs and symptoms in breast: Secondary | ICD-10-CM

## 2013-12-11 ENCOUNTER — Ambulatory Visit (INDEPENDENT_AMBULATORY_CARE_PROVIDER_SITE_OTHER): Payer: 59 | Admitting: Family Medicine

## 2013-12-11 ENCOUNTER — Encounter: Payer: Self-pay | Admitting: Family Medicine

## 2013-12-11 ENCOUNTER — Other Ambulatory Visit (HOSPITAL_COMMUNITY): Payer: Self-pay | Admitting: *Deleted

## 2013-12-11 ENCOUNTER — Telehealth: Payer: Self-pay | Admitting: Family Medicine

## 2013-12-11 VITALS — BP 110/80 | HR 122 | Temp 98.5°F | Ht 67.0 in | Wt 119.2 lb

## 2013-12-11 DIAGNOSIS — R0789 Other chest pain: Secondary | ICD-10-CM | POA: Insufficient documentation

## 2013-12-11 DIAGNOSIS — F411 Generalized anxiety disorder: Secondary | ICD-10-CM | POA: Insufficient documentation

## 2013-12-11 DIAGNOSIS — R079 Chest pain, unspecified: Secondary | ICD-10-CM

## 2013-12-11 NOTE — Assessment & Plan Note (Signed)
Eval TSH to make sure not cause of tachy and anxiety.  Definitely this could be contributing to symptoms.

## 2013-12-11 NOTE — Telephone Encounter (Signed)
Noted and agree. 

## 2013-12-11 NOTE — Telephone Encounter (Signed)
Noted  

## 2013-12-11 NOTE — Patient Instructions (Signed)
Stop at front desk to set up [place] and got to lab for further eval.  Stat ibuprofen 800 mg every 8 hours with food.  Call if symptoms worsening or if shortness of breath.  GO to ER if severe chest pain and SOB.

## 2013-12-11 NOTE — Progress Notes (Signed)
   Subjective:    Patient ID: [name], female    DOB: [birth year], 38 y.o.   MRN: [id number]  HPI  38 year old female presents with new onset  Chest pain  Off and on x several months.  Noted first several months ago... First episode when she moves her head.  She notes when she picks up son or turns head and leans back.   Left suprasternal pain.   o change with meals. No associated wth syncope or dizziness.    She now notes 4-5 times a day. Lasts seconds.  She tried omeprazole 20 mg daily, occ pepcid, tunms.. Helped minimmally  No exertional pain, no change with breathing/deep breaths.  No fatigue. No SOB., no wheeze. Had bronchitis (cough congestion, fever) in 10/2013.Marland Kitchen Took 5 days of amoxicillin.   She has not noted heart racing at home... She tends to be anxious. She has been struggling with anxiety. No depression. She has a 32 month old. She works as a Software engineer at [employer], very stressful lately. She denies insomnia.  Nml mammogram last week and  nml labs in last month.  She has osteopenia.. Concerned about rib fracture.    Review of Systems  Constitutional: Negative for fever and fatigue.  HENT: Negative for ear pain.   Eyes: Negative for pain.  Respiratory: Negative for chest tightness and shortness of breath.   Cardiovascular: Positive for chest pain. Negative for palpitations and leg swelling.  Gastrointestinal: Negative for abdominal pain.  Genitourinary: Negative for dysuria.       Objective:   Physical Exam  Constitutional: Vital signs are normal. She appears well-developed and well-nourished. She is cooperative.  Non-toxic appearance. She does not appear ill. No distress.  HENT:  Head: Normocephalic.  Right Ear: Hearing, tympanic membrane, external ear and ear canal normal. Tympanic membrane is not erythematous, not retracted and not bulging.  Left Ear: Hearing, tympanic membrane, external ear and ear canal normal. Tympanic membrane is not  erythematous, not retracted and not bulging.  Nose: No mucosal edema or rhinorrhea. Right sinus exhibits no maxillary sinus tenderness and no frontal sinus tenderness. Left sinus exhibits no maxillary sinus tenderness and no frontal sinus tenderness.  Mouth/Throat: Uvula is midline, oropharynx is clear and moist and mucous membranes are normal.  Eyes: Conjunctivae, EOM and lids are normal. Pupils are equal, round, and reactive to light. Lids are everted and swept, no foreign bodies found.  Neck: Trachea normal and normal range of motion. Neck supple. Carotid bruit is not present. No mass and no thyromegaly present.  Cardiovascular: Regular rhythm, S1 normal, S2 normal, normal heart sounds, intact distal pulses and normal pulses.  Tachycardia present.  Exam reveals no gallop, no distant heart sounds and no friction rub.   No murmur heard. Pulmonary/Chest: Effort normal and breath sounds normal. Not tachypneic. No respiratory distress. She has no decreased breath sounds. She has no wheezes. She has no rhonchi. She has no rales.  Abdominal: Soft. Normal appearance and bowel sounds are normal. There is no tenderness.  Neurological: She is alert.  Skin: Skin is warm, dry and intact. No rash noted.  Psychiatric: Her speech is normal and behavior is normal. Judgment and thought content normal. Her mood appears not anxious. Cognition and memory are normal. She does not exhibit a depressed mood.          Assessment & Plan:

## 2013-12-11 NOTE — Assessment & Plan Note (Addendum)
Not typical of chest pain, more consistent with anxiety or costochondritis but pt with tachycardia.  EKG was abnormal showing.. ? Axis deviation and ? Atrial enlargement and tachycardia.  No previous for comparison. No ST changes  No clear sign of pericarditis with rub etc.  Will eval with ECHO.  Will eval with labs.. Cbc TSH, CMET.

## 2013-12-11 NOTE — Progress Notes (Signed)
Pre-visit discussion using our clinic review tool. No additional management support is needed unless otherwise documented below in the visit note.  

## 2013-12-11 NOTE — Telephone Encounter (Signed)
Patient Information:  Caller Name: [name]  Phone: [phone]  Patient: Cindy, Robinson  Gender: Female  DOB: Mar 18, 1976  Age: 38 Years  PCP: Eliezer Lofts (Family Practice)  Pregnant: No  Office Follow Up:  Does the office need to follow up with this patient?: No  Instructions For The Office: N/A  RN Note:  Declines to come to office now due chronicity of intermittent chest pain with movement and work schedule.  Requested to be seen after 1400.  Symptoms  Reason For Call & Symptoms: Intermittent, dull chest discomfort/pain in mid to left sternal area for past 2 months.  Pain occurs only with movement, ie: lifiting kids, or deep breath.  Reviewed Health History In EMR: Yes  Reviewed Medications In EMR: Yes  Reviewed Allergies In EMR: Yes  Reviewed Surgeries / Procedures: Yes  Date of Onset of Symptoms: 10/11/2013  Treatments Tried: Proton pump inhibitor  Treatments Tried Worked: No OB / GYN:  LMP: 11/24/2013  Guideline(s) Used:  Chest Pain  Disposition Per Guideline:   Go to ED Now (or to Office with PCP Approval)  Reason For Disposition Reached:   Intermittent chest pain and pain has been increasing in severity or frequency  Advice Given:  Fleeting Chest Pain:  Fleeting chest pains that last only a few seconds and then go away are generally not serious. They may be from pinched muscles or nerves in your chest wall.  Expected Course:  These mild chest pains usually disappear within 3 days.  Call Back If:  Severe chest pain  Constant chest pain lasting longer than 5 minutes  Difficulty breathing  Fever  You become worse.  RN Overrode Recommendation:  Make Appointment  Refused to be seen immediately due to chronicity of intermittent chest pain.  Requests appointment after work at 1400.  Appointment Scheduled:  12/11/2013 15:15:00 Appointment Scheduled Provider:  Eliezer Lofts [facility])

## 2013-12-12 ENCOUNTER — Other Ambulatory Visit (INDEPENDENT_AMBULATORY_CARE_PROVIDER_SITE_OTHER): Payer: 59

## 2013-12-12 ENCOUNTER — Telehealth: Payer: Self-pay | Admitting: Family Medicine

## 2013-12-12 ENCOUNTER — Other Ambulatory Visit: Payer: Self-pay

## 2013-12-12 DIAGNOSIS — R079 Chest pain, unspecified: Secondary | ICD-10-CM

## 2013-12-12 DIAGNOSIS — R000 Tachycardia, unspecified: Secondary | ICD-10-CM

## 2013-12-12 DIAGNOSIS — R0789 Other chest pain: Secondary | ICD-10-CM

## 2013-12-12 LAB — CBC WITH DIFFERENTIAL/PLATELET
BASOS PCT: 0.4 % (ref 0.0–3.0)
Basophils Absolute: 0 10*3/uL (ref 0.0–0.1)
EOS PCT: 0.1 % (ref 0.0–5.0)
Eosinophils Absolute: 0 10*3/uL (ref 0.0–0.7)
HEMATOCRIT: 40.8 % (ref 36.0–46.0)
HEMOGLOBIN: 13.9 g/dL (ref 12.0–15.0)
Lymphocytes Relative: 12.9 % (ref 12.0–46.0)
Lymphs Abs: 0.9 10*3/uL (ref 0.7–4.0)
MCHC: 34 g/dL (ref 30.0–36.0)
MCV: 92.5 fl (ref 78.0–100.0)
MONOS PCT: 3.4 % (ref 3.0–12.0)
Monocytes Absolute: 0.2 10*3/uL (ref 0.1–1.0)
Neutro Abs: 6 10*3/uL (ref 1.4–7.7)
Neutrophils Relative %: 83.2 % — ABNORMAL HIGH (ref 43.0–77.0)
Platelets: 235 10*3/uL (ref 150.0–400.0)
RBC: 4.41 Mil/uL (ref 3.87–5.11)
RDW: 13.8 % (ref 11.5–14.6)
WBC: 7.2 10*3/uL (ref 4.5–10.5)

## 2013-12-12 LAB — COMPREHENSIVE METABOLIC PANEL
ALT: 12 U/L (ref 0–35)
AST: 18 U/L (ref 0–37)
Albumin: 4.6 g/dL (ref 3.5–5.2)
Alkaline Phosphatase: 58 U/L (ref 39–117)
BILIRUBIN TOTAL: 0.8 mg/dL (ref 0.3–1.2)
BUN: 12 mg/dL (ref 6–23)
CO2: 25 meq/L (ref 19–32)
CREATININE: 0.7 mg/dL (ref 0.4–1.2)
Calcium: 9.4 mg/dL (ref 8.4–10.5)
Chloride: 106 mEq/L (ref 96–112)
GFR: 104.8 mL/min (ref >60.00)
GLUCOSE: 98 mg/dL (ref 70–99)
Potassium: 4.6 mEq/L (ref 3.5–5.1)
SODIUM: 138 meq/L (ref 135–145)
Total Protein: 7.7 g/dL (ref 6.0–8.3)

## 2013-12-12 LAB — TSH: TSH: 1.3 u[IU]/mL (ref 0.35–5.50)

## 2013-12-12 NOTE — Telephone Encounter (Signed)
Called pt to state ECHO appears normal. Nml EF, no atrial enlargement , no pericardial fluid. Labs were normal. LMOM on cell, pt to continue with current plan.  Follow up if not improving.

## 2014-09-28 ENCOUNTER — Encounter: Payer: Self-pay | Admitting: Family Medicine

## 2014-10-08 LAB — OB RESULTS CONSOLE RUBELLA ANTIBODY, IGM: Rubella: IMMUNE

## 2014-10-08 LAB — OB RESULTS CONSOLE HIV ANTIBODY (ROUTINE TESTING): HIV: NONREACTIVE

## 2014-10-08 LAB — OB RESULTS CONSOLE ABO/RH: RH Type: POSITIVE

## 2014-10-08 LAB — OB RESULTS CONSOLE ANTIBODY SCREEN: Antibody Screen: NEGATIVE

## 2014-10-08 LAB — OB RESULTS CONSOLE RPR: RPR: NONREACTIVE

## 2014-10-08 LAB — OB RESULTS CONSOLE HEPATITIS B SURFACE ANTIGEN: HEP B S AG: NEGATIVE

## 2014-10-12 LAB — OB RESULTS CONSOLE GC/CHLAMYDIA
CHLAMYDIA, DNA PROBE: NEGATIVE
Gonorrhea: NEGATIVE

## 2014-11-03 ENCOUNTER — Encounter: Payer: Self-pay | Admitting: Internal Medicine

## 2014-11-03 ENCOUNTER — Ambulatory Visit (INDEPENDENT_AMBULATORY_CARE_PROVIDER_SITE_OTHER): Payer: 59 | Admitting: Internal Medicine

## 2014-11-03 VITALS — BP 102/62 | HR 107 | Temp 97.8°F | Wt 119.1 lb

## 2014-11-03 DIAGNOSIS — J0100 Acute maxillary sinusitis, unspecified: Secondary | ICD-10-CM

## 2014-11-03 MED ORDER — AMOXICILLIN 500 MG PO CAPS: 1000.0000 mg | ORAL_CAPSULE | Freq: Two times a day (BID) | ORAL | Status: DC

## 2014-11-03 MED ORDER — AMOXICILLIN 500 MG PO CAPS
1000.0000 mg | ORAL_CAPSULE | Freq: Two times a day (BID) | ORAL | Status: DC
Start: 2014-11-03 — End: 2014-11-03

## 2014-11-03 NOTE — Progress Notes (Signed)
Pre visit review using our clinic review tool, if applicable. No additional management support is needed unless otherwise documented below in the visit note. 

## 2014-11-03 NOTE — Patient Instructions (Signed)
Rest, fluids , tylenol  If  cough, take robitussin DM no more than  twice a day as needed   If nasal  congestion use a saline nasal spray  Get pseudoephedrine 30 mg (behind the counter, you need to talk with the pharmacist) take one tablet 3 or 4 times a day as needed for congestion  Take the antibiotic as prescribed  (Amoxicillin)  Call if not gradually better over the next  10 days Call anytime if the symptoms are severe

## 2014-11-03 NOTE — Progress Notes (Signed)
   Subjective:    Patient ID: [name], female    DOB: 10-25-1976, 38 y.o.   MRN: [id number]  DOS:  11/03/2014 Type of visit - description : acute Interval history: Respiratory symptoms started about a week ago with sinus pain, congestion, green thick nasal discharge. Yesterday for the first time she had a dry cough at night( "I had a dry spot in the throat"). Her husband and  her son have been sick with cough. Her other son has an ear infection. Her left-upper side teeth are hurting   ROS Temperature was 99.6 last night, occasional mild chills. No chest pain or difficulty breathing No nausea, vomiting, diarrhea. Mild aches yesterday  Past Medical History  Diagnosis Date  . Osteopenia   . Hypercalcemia   . Postpartum care following vaginal delivery (7/26) 06/22/2012    Past Surgical History  Procedure Laterality Date  . Lasik    . Nsvd   07/23/2009  . Wisdom tooth extraction      History   Social History  . Marital Status: Married    Spouse Name: N/A    Number of Children: N/A  . Years of Education: N/A   Occupational History  . Not on file.   Social History Main Topics  . Smoking status: Never Smoker   . Smokeless tobacco: Never Used  . Alcohol Use: No  . Drug Use: No  . Sexual Activity: Yes    Birth Control/ Protection: None   Other Topics Concern  . Not on file   Social History Narrative   Exercise-none   Fruits and veggies, 3 meals a day. Minimal snacking.        Medication List       This list is accurate as of: 11/03/14  5:57 PM.  Always use your most recent med list.               amoxicillin 500 MG capsule  Commonly known as:  AMOXIL  Take 2 capsules (1,000 mg total) by mouth 2 (two) times daily.     loratadine 10 MG tablet  Commonly known as:  CLARITIN  Take 10 mg by mouth daily as needed. For allergy.           Objective:   Physical Exam BP 102/62 mmHg  Pulse 107  Temp(Src) 97.8 F (36.6 C) (Oral)  Wt 119 lb 2 oz  (54.035 kg)  SpO2 98%  LMP 11/24/2013  General -- alert, well-developed, NAD.  HEENT-- Not pale.  R Ear-- normal L ear-- normal Throat symmetric, no redness or discharge. Face symmetric, sinuses slt TTP @ Left max area. Nose slt congested. Lungs -- normal respiratory effort, no intercostal retractions, no accessory muscle use, and normal breath sounds.  Heart-- normal rate, regular rhythm, no murmur.   Extremities-- no pretibial edema bilaterally  Neurologic--  alert & oriented X3. Speech normal, gait appropriate for age, strength symmetric and appropriate for age.  Psych-- Cognition and judgment appear intact. Cooperative with normal attention span and concentration. No anxious or depressed appearing.      Assessment & Plan:   Sinusitis 38 year old lady who is [redacted] weeks pregnant presents  with symptoms and findings consistent with sinusitis. She is a slightly tachycardic but not toxic appearing. Plan: See instructions

## 2014-11-27 NOTE — L&D Delivery Note (Signed)
Delivery Note At 5:05 AM a viable and healthy female was delivered via Vaginal, Spontaneous Delivery (Presentation: ; Occiput Anterior).  APGAR: 9, 9; weight  .pending   Placenta status: Intact, Spontaneous. (+) large clots c/w abruption. To path Cord: 3 vessels with the following complications: None.  Cord pH: none  Anesthesia: Local  Episiotomy:  none Lacerations: 2nd degree;Perineal Suture Repair: 3.0 chromic Est. Blood Loss (mL):  250  Mom to postpartum.  Baby to Couplet care / Skin to Skin.  , A 04/15/2015, 5:36 AM

## 2015-04-15 ENCOUNTER — Encounter (HOSPITAL_COMMUNITY): Payer: Self-pay

## 2015-04-15 ENCOUNTER — Inpatient Hospital Stay (HOSPITAL_COMMUNITY)
Admission: AD | Admit: 2015-04-15 | Discharge: 2015-04-17 | DRG: 775 | Disposition: A | Discharge status: Home / Self Care | Payer: 59 | Source: Ambulatory Visit | Attending: Obstetrics and Gynecology | Admitting: Obstetrics and Gynecology

## 2015-04-15 DIAGNOSIS — O09523 Supervision of elderly multigravida, third trimester: Secondary | ICD-10-CM | POA: Diagnosis present

## 2015-04-15 DIAGNOSIS — Z3A38 38 weeks gestation of pregnancy: Secondary | ICD-10-CM | POA: Diagnosis present

## 2015-04-15 LAB — CBC
HEMATOCRIT: 35.2 % — AB (ref 36.0–46.0)
Hemoglobin: 11.7 g/dL — ABNORMAL LOW (ref 12.0–15.0)
MCH: 30.5 pg (ref 26.0–34.0)
MCHC: 33.2 g/dL (ref 30.0–36.0)
MCV: 91.7 fL (ref 78.0–100.0)
PLATELETS: 169 10*3/uL (ref 150–400)
RBC: 3.84 MIL/uL — ABNORMAL LOW (ref 3.87–5.11)
RDW: 13.9 % (ref 11.5–15.5)
WBC: 9.6 10*3/uL (ref 4.0–10.5)

## 2015-04-15 LAB — TYPE AND SCREEN
ABO/RH(D): A POS
Antibody Screen: NEGATIVE

## 2015-04-15 LAB — OB RESULTS CONSOLE GBS: GBS: NEGATIVE

## 2015-04-15 LAB — RPR: RPR Ser Ql: NONREACTIVE

## 2015-04-15 LAB — ABO/RH: ABO/RH(D): A POS

## 2015-04-15 MED ORDER — CITRIC ACID-SODIUM CITRATE 334-500 MG/5ML PO SOLN: 30.0000 mL | ORAL | Status: DC | PRN

## 2015-04-15 MED ORDER — FLEET ENEMA 7-19 GM/118ML RE ENEM: 1.0000 | ENEMA | RECTAL | Status: DC | PRN

## 2015-04-15 MED ORDER — FERROUS SULFATE 325 (65 FE) MG PO TABS
325.0000 mg | ORAL_TABLET | Freq: Two times a day (BID) | ORAL | Status: DC
  Filled 2015-04-15: qty 1

## 2015-04-15 MED ORDER — LIDOCAINE HCL (PF) 1 % IJ SOLN
INTRAMUSCULAR | Status: AC
  Filled 2015-04-15: qty 30

## 2015-04-15 MED ORDER — ONDANSETRON HCL 4 MG/2ML IJ SOLN: 4.0000 mg | INTRAMUSCULAR | Status: DC | PRN

## 2015-04-15 MED ORDER — OXYTOCIN 10 UNIT/ML IJ SOLN
INTRAMUSCULAR | Status: AC
  Administered 2015-04-15: 10 [IU]
  Filled 2015-04-15: qty 2

## 2015-04-15 MED ORDER — OXYCODONE-ACETAMINOPHEN 5-325 MG PO TABS: 1.0000 | ORAL_TABLET | ORAL | Status: DC | PRN

## 2015-04-15 MED ORDER — OXYCODONE-ACETAMINOPHEN 5-325 MG PO TABS
1.0000 | ORAL_TABLET | ORAL | Status: DC | PRN
Start: 2015-04-15 — End: 2015-04-15

## 2015-04-15 MED ORDER — ONDANSETRON HCL 4 MG/2ML IJ SOLN: 4.0000 mg | Freq: Four times a day (QID) | INTRAMUSCULAR | Status: DC | PRN

## 2015-04-15 MED ORDER — DIPHENHYDRAMINE HCL 25 MG PO CAPS: 25.0000 mg | ORAL_CAPSULE | Freq: Four times a day (QID) | ORAL | Status: DC | PRN

## 2015-04-15 MED ORDER — DIBUCAINE 1 % RE OINT: 1.0000 "application " | TOPICAL_OINTMENT | RECTAL | Status: DC | PRN

## 2015-04-15 MED ORDER — SENNOSIDES-DOCUSATE SODIUM 8.6-50 MG PO TABS
2.0000 | ORAL_TABLET | ORAL | Status: DC
  Administered 2015-04-16: 1 via ORAL
  Administered 2015-04-16: 2 via ORAL
  Filled 2015-04-15 (×2): qty 2

## 2015-04-15 MED ORDER — SIMETHICONE 80 MG PO CHEW: 80.0000 mg | CHEWABLE_TABLET | ORAL | Status: DC | PRN

## 2015-04-15 MED ORDER — ACETAMINOPHEN 325 MG PO TABS: 650.0000 mg | ORAL_TABLET | ORAL | Status: DC | PRN

## 2015-04-15 MED ORDER — ACETAMINOPHEN 325 MG PO TABS
650.0000 mg | ORAL_TABLET | ORAL | Status: DC | PRN
  Administered 2015-04-15: 650 mg via ORAL
  Filled 2015-04-15: qty 2

## 2015-04-15 MED ORDER — ONDANSETRON HCL 4 MG PO TABS: 4.0000 mg | ORAL_TABLET | ORAL | Status: DC | PRN

## 2015-04-15 MED ORDER — OXYTOCIN 40 UNITS IN LACTATED RINGERS INFUSION - SIMPLE MED: 62.5000 mL/h | INTRAVENOUS | Status: DC

## 2015-04-15 MED ORDER — IBUPROFEN 600 MG PO TABS
600.0000 mg | ORAL_TABLET | Freq: Four times a day (QID) | ORAL | Status: DC
  Administered 2015-04-15 – 2015-04-17 (×10): 600 mg via ORAL
  Filled 2015-04-15 (×10): qty 1

## 2015-04-15 MED ORDER — LACTATED RINGERS IV SOLN: INTRAVENOUS | Status: DC

## 2015-04-15 MED ORDER — PRENATAL MULTIVITAMIN CH
1.0000 | ORAL_TABLET | Freq: Every day | ORAL | Status: DC
  Filled 2015-04-15 (×3): qty 1

## 2015-04-15 MED ORDER — LACTATED RINGERS IV SOLN: 500.0000 mL | INTRAVENOUS | Status: DC | PRN

## 2015-04-15 MED ORDER — BENZOCAINE-MENTHOL 20-0.5 % EX AERO
1.0000 "application " | INHALATION_SPRAY | CUTANEOUS | Status: DC | PRN
  Administered 2015-04-15: 1 via TOPICAL
  Filled 2015-04-15: qty 56

## 2015-04-15 MED ORDER — LIDOCAINE HCL (PF) 1 % IJ SOLN
30.0000 mL | INTRAMUSCULAR | Status: DC | PRN
  Administered 2015-04-15: 30 mL via SUBCUTANEOUS
  Filled 2015-04-15: qty 30

## 2015-04-15 MED ORDER — OXYCODONE-ACETAMINOPHEN 5-325 MG PO TABS: 2.0000 | ORAL_TABLET | ORAL | Status: DC | PRN

## 2015-04-15 MED ORDER — ZOLPIDEM TARTRATE 5 MG PO TABS: 5.0000 mg | ORAL_TABLET | Freq: Every evening | ORAL | Status: DC | PRN

## 2015-04-15 MED ORDER — LANOLIN HYDROUS EX OINT: TOPICAL_OINTMENT | CUTANEOUS | Status: DC | PRN

## 2015-04-15 MED ORDER — WITCH HAZEL-GLYCERIN EX PADS: 1.0000 "application " | MEDICATED_PAD | CUTANEOUS | Status: DC | PRN

## 2015-04-15 MED ORDER — OXYTOCIN BOLUS FROM INFUSION: 500.0000 mL | INTRAVENOUS | Status: DC

## 2015-04-15 NOTE — MAU Note (Signed)
Contractions since 3 am. Bloody show. No LOF. Positive fetal movement.

## 2015-04-15 NOTE — H&P (Signed)
[name] is a 39 y.o. female presenting in active labor, intact membrane                          Maternal Medical History:  Reason for admission: Contractions.   Contractions: Onset was 3-5 hours ago.   Frequency: regular.   Perceived severity is strong.    Fetal activity: Perceived fetal activity is normal.    Prenatal complications: no prenatal complications   OB History    Gravida Para Term Preterm AB TAB SAB Ectopic Multiple Living   4 2 2  1  1   2      Past Medical History  Diagnosis Date  . Osteopenia   . Hypercalcemia   . Postpartum care following vaginal delivery (7/26) 06/22/2012   Past Surgical History  Procedure Laterality Date  . Lasik    . Nsvd   07/23/2009  . Wisdom tooth extraction     Family History: family history includes Cancer in her maternal grandfather; Diabetes in her maternal grandmother and maternal uncle; Heart attack in her father; Kidney disease in her maternal grandmother and maternal uncle; Osteoporosis in her paternal grandmother. Social History:  reports that she has never smoked. She has never used smokeless tobacco. She reports that she does not drink alcohol or use illicit drugs.   Prenatal Transfer Tool  Maternal Diabetes: No Genetic Screening: Normal Maternal Ultrasounds/Referrals: Normal Fetal Ultrasounds or other Referrals:  None Maternal Substance Abuse:  No Significant Maternal Medications:  None Significant Maternal Lab Results:  Lab values include: Group B Strep negative Other Comments:  None  Review of Systems  All other systems reviewed and are negative.   Dilation: 10 Effacement (%): 90 Station: Crowning Exam by:: Jorje Guild RNC There were no vitals taken for this visit. Maternal Exam:  Uterine Assessment: Contraction strength is firm.  Abdomen: Patient reports no abdominal tenderness. 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   Physical Exam  Constitutional: She is  oriented to person, place, and time. She appears well-developed and well-nourished.  Eyes: EOM are normal.  Cardiovascular: Regular rhythm.   Respiratory: Breath sounds normal.  GI: Soft.  Neurological: She is alert and oriented to person, place, and time.  Skin: Skin is warm and dry.  Psychiatric: She has a normal mood and affect.    Prenatal labs: ABO, Rh: A/Positive/-- (11/12 0000) Antibody: Negative (11/12 0000) Rubella: Immune (11/12 0000) RPR: Nonreactive (11/12 0000)  HBsAg: Negative (11/12 0000)  HIV: Non-reactive (11/12 0000)  GBS: Negative (05/19 0000)   Assessment/Plan: Active labor IUP @ 38 weeks P) admit. Anticipate svd. Routine labs   , A 04/15/2015, 5:32 AM

## 2015-04-15 NOTE — Lactation Note (Signed)
This note was copied from the chart of Cindy Anderson Coppock. Lactation Consultation Note  Mom has a history of low milk supply with her first 2  Children.  Her breasts are round and well shaped with palpable glandular tissue. Hand expression was taught with visible colostrum and a double electric breast pump was set up to aid in lactation induction.  Mom is amenable to this plan.  The goal is for her to pump after 6-8 feedings in 24 hours on the preemie setting.  Any expressed amount will be fed back to the baby.  Parents understand that [name] must eat 8-12 times in 24 hours.  Follow-up tomorrow. Patient Name: [name] [id number] Date: 04/15/2015 Reason for consult: Initial assessment;Infant < 6lbs;Other (Comment) (history of low milk supply)   Maternal Data Has patient been taught Hand Expression?: Yes Does the patient have breastfeeding experience prior to this delivery?: Yes  Feeding Feeding Type: Breast Fed Length of feed: 10 min  LATCH Score/Interventions Latch: Repeated attempts needed to sustain latch, nipple held in mouth throughout feeding, stimulation needed to elicit sucking reflex.  Audible Swallowing: A few with stimulation  Type of Nipple: Everted at rest and after stimulation  Comfort (Breast/Nipple): Soft / non-tender     Hold (Positioning): Assistance needed to correctly position infant at breast and maintain latch.  LATCH Score: 7  Lactation Tools Discussed/Used     Consult Status Consult Status: Follow-up    Van Clines 04/15/2015, 4:35 PM

## 2015-04-15 NOTE — Progress Notes (Signed)
Interval Note:  S: Feels well. Pain controlled w/Ibuprofen.  O: VSS  A: S/p SVD, 2nd degree laceration  P: PPD #0     Continue routine postpartum care.     Planning circ.

## 2015-04-16 LAB — CBC
HCT: 29.2 % — ABNORMAL LOW (ref 36.0–46.0)
Hemoglobin: 9.7 g/dL — ABNORMAL LOW (ref 12.0–15.0)
MCH: 30.8 pg (ref 26.0–34.0)
MCHC: 33.2 g/dL (ref 30.0–36.0)
MCV: 92.7 fL (ref 78.0–100.0)
PLATELETS: 158 10*3/uL (ref 150–400)
RBC: 3.15 MIL/uL — AB (ref 3.87–5.11)
RDW: 14.1 % (ref 11.5–15.5)
WBC: 7.8 10*3/uL (ref 4.0–10.5)

## 2015-04-16 MED ORDER — MAGNESIUM OXIDE 400 (241.3 MG) MG PO TABS
400.0000 mg | ORAL_TABLET | Freq: Every day | ORAL | Status: DC
  Filled 2015-04-16 (×2): qty 1

## 2015-04-16 MED ORDER — FERROUS SULFATE 325 (65 FE) MG PO TABS
325.0000 mg | ORAL_TABLET | Freq: Every day | ORAL | Status: DC
  Filled 2015-04-16: qty 1

## 2015-04-16 MED ORDER — OXYCODONE-ACETAMINOPHEN 5-325 MG PO TABS: 0.5000 | ORAL_TABLET | ORAL | Status: DC | PRN

## 2015-04-16 NOTE — Progress Notes (Signed)
Patient requested only 1 sennokot tablet instead of 2

## 2015-04-16 NOTE — Progress Notes (Signed)
Patient refused both the iron and mag ox even after discussing with patient her hemoglobin and the doctor's reasoning behind the medications.   Cox,  M

## 2015-04-16 NOTE — Lactation Note (Signed)
This note was copied from the chart of [name]. Lactation Consultation Note; Experienced BF mom,with history of low milk supply. Reports [name] cluster fed through the night.  Plans for circ today. He had nursed on right breast for about 5 min and mom was latching him to left when I went in. She reports this breast always produces more. Lots of swallows noted. Reports she has only pumped once today because he has been nursing so much- few drops obtained. No questions at present. Has Medela pump for home. To call prn  Patient Name: [name] [id number] Date: 04/16/2015 Reason for consult: Follow-up assessment   Maternal Data Formula Feeding for Exclusion: No Has patient been taught Hand Expression?: Yes Does the patient have breastfeeding experience prior to this delivery?: Yes  Feeding Feeding Type: Breast Fed Length of feed: 10 min  LATCH Score/Interventions Latch: Grasps breast easily, tongue down, lips flanged, rhythmical sucking.  Audible Swallowing: Spontaneous and intermittent (on left breast)  Type of Nipple: Everted at rest and after stimulation  Comfort (Breast/Nipple): Soft / non-tender     Hold (Positioning): Assistance needed to correctly position infant at breast and maintain latch.  LATCH Score: 9  Lactation Tools Discussed/Used     Consult Status Consult Status: PRN    [name] 04/16/2015, 2:45 PM

## 2015-04-17 ENCOUNTER — Ambulatory Visit: Payer: Self-pay

## 2015-04-17 MED ORDER — MAGNESIUM OXIDE 400 (241.3 MG) MG PO TABS: 400.0000 mg | ORAL_TABLET | Freq: Every day | ORAL | Status: DC

## 2015-04-17 MED ORDER — IBUPROFEN 600 MG PO TABS: 600.0000 mg | ORAL_TABLET | Freq: Four times a day (QID) | ORAL | Status: AC

## 2015-04-17 MED ORDER — FERROUS SULFATE 325 (65 FE) MG PO TABS: 325.0000 mg | ORAL_TABLET | Freq: Every day | ORAL | Status: DC

## 2015-04-17 NOTE — Discharge Instructions (Signed)
Breast Pumping Tips °If you are breastfeeding, there may be times when you cannot feed your baby directly. Returning to work or going on a trip are common examples. Pumping allows you to store breast milk and feed it to your baby later.  °You may not get much milk when you first start to pump. Your breasts should start to make more after a few days. If you pump at the times you usually feed your baby, you may be able to keep making enough milk to feed your baby without also using formula. The more often you pump, the more milk you will produce.  °WHEN SHOULD I PUMP?  °· You can begin to pump soon after delivery. However, some experts recommend waiting about 4 weeks before giving your infant a bottle to make sure breastfeeding is going well.  °· If you plan to return to work, begin pumping a few weeks before. This will help you develop techniques that work best for you. It also lets you build up a supply of breast milk.   °· When you are with your infant, feed on demand and pump after each feeding.   °· When you are away from your infant for several hours, pump for about 15 minutes every 2-3 hours. Pump both breasts at the same time if you can.   °· If your infant has a formula feeding, make sure to pump around the same time.     °· If you drink any alcohol, wait 2 hours before pumping.   °HOW DO I PREPARE TO PUMP? °Your let-down reflex is the natural reaction to stimulation that makes your breast milk flow. It is easier to stimulate this reflex when you are relaxed. Find relaxation techniques that work for you. If you have difficulty with your let-down reflex, try these methods:  °· Smell one of your infant's blankets or an item of clothing.   °· Look at a picture or video of your infant.   °· Sit in a quiet, private space.   °· Massage the breast you plan to pump.   °· Place soothing warmth on the breast.   °· Play relaxing music.   °WHAT ARE SOME GENERAL BREAST PUMPING TIPS? °· Wash your hands before you pump. You  do not need to wash your nipples or breasts. °· There are three ways to pump. °¨ You can use your hand to massage and compress your breast. °¨ You can use a handheld manual pump. °¨ You can use an electric pump.   °· Make sure the suction cup (flange) on the breast pump is the right size. Place the flange directly over the nipple. If it is the wrong size or placed the wrong way, it may be painful and cause nipple damage.   °· If pumping is uncomfortable, apply a small amount of purified or modified lanolin to your nipple and areola. °· If you are using an electric pump, adjust the speed and suction power to be more comfortable. °· If pumping is painful or if you are not getting very much milk, you may need a different type of pump. A lactation consultant can help you determine what type of pump to use.   °· Keep a full water bottle near you at all times. Drinking lots of fluid helps you make more milk.  °· You can store your milk to use later. Pumped breast milk can be stored in a sealable, sterile container or plastic bag. Label all stored breast milk with the date you pumped it. °¨ Milk can stay out at room temperature for up to 8 hours. °¨   You can store your milk in the refrigerator for up to 8 days. °¨ You can store your milk in the freezer for 3 months. Thaw frozen milk using warm water. Do not put it in the microwave. °· Do not smoke. Smoking can lower your milk supply and harm your infant. If you need help quitting, ask your health care provider to recommend a program.   °WHEN SHOULD I CALL MY HEALTH CARE PROVIDER OR A LACTATION CONSULTANT? °· You are having trouble pumping. °· You are concerned that you are not making enough milk. °· You have nipple pain, soreness, or redness. °· You want to use birth control. Birth control pills may lower your milk supply. Talk to your health care provider about your options. °Document Released: 05/03/2010 Document Revised: 11/18/2013 Document Reviewed:  09/05/2013 °[employer]® Patient Information ©2015 [employer]. This information is not intended to replace advice given to you by your health care provider. Make sure you discuss any questions you have with your health care provider. ° °Nutrition for the New Mother  °A new mother needs good health and nutrition so she can have energy to take care of a new baby. Whether a mother breastfeeds or formula feeds the baby, it is important to have a well-balanced diet. Foods from all the food groups should be chosen to meet the new mother's energy needs and to give her the nutrients needed for repair and healing.  °A HEALTHY EATING PLAN °The My Pyramid plan for Moms outlines what you should eat to help you and your baby stay healthy. The energy and amount of food you need depends on whether or not you are breastfeeding. If you are breastfeeding you will need more nutrients. If you choose not to breastfeed, your nutrition goal should be to return to a healthy weight. Limiting calories may be needed if you are not breastfeeding.  °HOME CARE INSTRUCTIONS  °· For a personal plan based on your unique needs, see your Registered Dietitian or visit www.mypyramid.gov. °· Eat a variety of foods. The plan below will help guide you. The following chart has a suggested daily meal plan from the My Pyramid for Moms. °· Eat a variety of fruits and vegetables. °· Eat more dark green and orange vegetables and cooked dried beans. °· Make half your grains whole grains. Choose whole instead of refined grains. °· Choose low-fat or lean meats and poultry. °· Choose low-fat or fat-free dairy products like milk, cheese, or yogurt. °Fruits °· Breastfeeding: 2 cups °· Non-Breastfeeding: 2 cups °· What Counts as a serving? °¨ 1 cup of fruit or juice. °¨ ½ cup dried fruit. °Vegetables °· Breastfeeding: 3 cups °· Non-Breastfeeding: 2 ½ cups °· What Counts as a serving? °¨ 1 cup raw or cooked vegetables. °¨ Juice or 2 cups raw leafy  vegetables. °Grains °· Breastfeeding: 8 oz °· Non-Breastfeeding: 6 oz °· What Counts as a serving? °¨ 1 slice bread. °¨ 1 oz ready-to-eat cereal. °¨ ½ cup cooked pasta, rice, or cereal. °Meat and Beans °· Breastfeeding: 6 ½ oz °· Non-Breastfeeding: 5 ½ oz °· What Counts as a serving? °¨ 1 oz lean meat, poultry, or fish °¨ ¼ cup cooked dry beans °¨ ½ oz nuts or 1 egg °¨ 1 tbs peanut butter °Milk °· Breastfeeding: 3 cups °· Non-Breastfeeding: 3 cups °· What Counts as a serving? °¨ 1 cup milk. °¨ 8 oz yogurt. °¨ 1 ½ oz cheese. °¨ 2 oz processed cheese. °TIPS FOR THE BREASTFEEDING MOM °· Rapid weight   loss is not suggested when you are breastfeeding. By simply breastfeeding, you will be able to lose the weight gained during your pregnancy. Your caregiver can keep track of your weight and tell you if your weight loss is appropriate.  Be sure to drink fluids. You may notice that you are thirstier than usual. A suggestion is to drink a glass of water or other beverage whenever you breastfeed.  Avoid alcohol as it can be passed into your breast milk.  Limit caffeine drinks to no more than 2 to 3 cups per day.  You may need to keep taking your prenatal vitamin while you are breastfeeding. Talk with your caregiver about taking a vitamin or supplement. RETURING TO A HEALTHY WEIGHT  The My Pyramid Plan for Moms will help you return to a healthy weight. It will also provide the nutrients you need.  You may need to limit "empty" calories. These include:  High fat foods like fried foods, fatty meats, fast food, butter, and mayonnaise.  High sugar foods like sodas, jelly, candy, and sweets.  Be physically active. Include 30 minutes of exercise or more each day. Choose an activity you like such as walking, swimming, biking, or aerobics. Check with your caregiver before you start to exercise. Document Released: 02/20/2008 Document Revised: 02/05/2012 Document Reviewed: 02/20/2008 [facility] Patient Information  2015 [place], [place]. This information is not intended to replace advice given to you by your health care provider. Make sure you discuss any questions you have with your health care provider. Postpartum Depression and Baby Blues The postpartum period begins right after the birth of a baby. During this time, there is often a great amount of joy and excitement. It is also a time of many changes in the life of the parents. Regardless of how many times a mother gives birth, each child brings new challenges and dynamics to the family. It is not unusual to have feelings of excitement along with confusing shifts in moods, emotions, and thoughts. All mothers are at risk of developing postpartum depression or the "baby blues." These mood changes can occur right after giving birth, or they may occur many months after giving birth. The baby blues or postpartum depression can be mild or severe. Additionally, postpartum depression can go away rather quickly, or it can be a long-term condition.  CAUSES Raised hormone levels and the rapid drop in those levels are thought to be a main cause of postpartum depression and the baby blues. A number of hormones change during and after pregnancy. Estrogen and progesterone usually decrease right after the delivery of your baby. The levels of thyroid hormone and various cortisol steroids also rapidly drop. Other factors that play a role in these mood changes include major life events and genetics.  RISK FACTORS If you have any of the following risks for the baby blues or postpartum depression, know what symptoms to watch out for during the postpartum period. Risk factors that may increase the likelihood of getting the baby blues or postpartum depression include:  Having a personal or family history of depression.   Having depression while being pregnant.   Having premenstrual mood issues or mood issues related to oral contraceptives.  Having a lot of life stress.   Having  marital conflict.   Lacking a social support network.   Having a baby with special needs.   Having health problems, such as diabetes.  SIGNS AND SYMPTOMS Symptoms of baby blues include:  Brief changes in mood, such as going  from extreme happiness to sadness.  Decreased concentration.   Difficulty sleeping.   Crying spells, tearfulness.   Irritability.   Anxiety.  Symptoms of postpartum depression typically begin within the first month after giving birth. These symptoms include:  Difficulty sleeping or excessive sleepiness.   Marked weight loss.   Agitation.   Feelings of worthlessness.   Lack of interest in activity or food.  Postpartum psychosis is a very serious condition and can be dangerous. Fortunately, it is rare. Displaying any of the following symptoms is cause for immediate medical attention. Symptoms of postpartum psychosis include:   Hallucinations and delusions.   Bizarre or disorganized behavior.   Confusion or disorientation.  DIAGNOSIS  A diagnosis is made by an evaluation of your symptoms. There are no medical or lab tests that lead to a diagnosis, but there are various questionnaires that a health care provider may use to identify those with the baby blues, postpartum depression, or psychosis. Often, a screening tool called the [place] Postnatal Depression Scale is used to diagnose depression in the postpartum period.  TREATMENT The baby blues usually goes away on its own in 1-2 weeks. Social support is often all that is needed. You will be encouraged to get adequate sleep and rest. Occasionally, you may be given medicines to help you sleep.  Postpartum depression requires treatment because it can last several months or longer if it is not treated. Treatment may include individual or group therapy, medicine, or both to address any social, physiological, and psychological factors that may play a role in the depression. Regular exercise, a  healthy diet, rest, and social support may also be strongly recommended.  Postpartum psychosis is more serious and needs treatment right away. Hospitalization is often needed. HOME CARE INSTRUCTIONS  Get as much rest as you can. Nap when the baby sleeps.   Exercise regularly. Some women find yoga and walking to be beneficial.   Eat a balanced and nourishing diet.   Do little things that you enjoy. Have a cup of tea, take a bubble bath, read your favorite magazine, or listen to your favorite music.  Avoid alcohol.   Ask for help with household chores, cooking, grocery shopping, or running errands as needed. Do not try to do everything.   Talk to people close to you about how you are feeling. Get support from your partner, family members, friends, or other new moms.  Try to stay positive in how you think. Think about the things you are grateful for.   Do not spend a lot of time alone.   Only take over-the-counter or prescription medicine as directed by your health care provider.  Keep all your postpartum appointments.   Let your health care provider know if you have any concerns.  SEEK MEDICAL CARE IF: You are having a reaction to or problems with your medicine. SEEK IMMEDIATE MEDICAL CARE IF:  You have suicidal feelings.   You think you may harm the baby or someone else. MAKE SURE YOU:  Understand these instructions.  Will watch your condition.  Will get help right away if you are not doing well or get worse. Document Released: 08/17/2004 Document Revised: 11/18/2013 Document Reviewed: 08/25/2013 [facility] Patient Information 2015 [place], [place]. This information is not intended to replace advice given to you by your health care provider. Make sure you discuss any questions you have with your health care provider. Breastfeeding and Mastitis Mastitis is inflammation of the breast tissue. It can occur in women who  are breastfeeding. This can make breastfeeding  painful. Mastitis will sometimes go away on its own. Your health care provider will help determine if treatment is needed. CAUSES Mastitis is often associated with a blocked milk (lactiferous) duct. This can happen when too much milk builds up in the breast. Causes of excess milk in the breast can include:  Poor latch-on. If your baby is not latched onto the breast properly, she or he may not empty your breast completely while breastfeeding.  Allowing too much time to pass between feedings.  Wearing a bra or other clothing that is too tight. This puts extra pressure on the lactiferous ducts so milk does not flow through them as it should. Mastitis can also be caused by a bacterial infection. Bacteria may enter the breast tissue through cuts or openings in the skin. In women who are breastfeeding, this may occur because of cracked or irritated skin. Cracks in the skin are often caused when your baby does not latch on properly to the breast. SIGNS AND SYMPTOMS  Swelling, redness, tenderness, and pain in an area of the breast.  Swelling of the glands under the arm on the same side.  Fever may or may not accompany mastitis. If an infection is allowed to progress, a collection of pus (abscess) may develop. DIAGNOSIS  Your health care provider can usually diagnose mastitis based on your symptoms and a physical exam. Tests may be done to help confirm the diagnosis. These may include:  Removal of pus from the breast by applying pressure to the area. This pus can be examined in the lab to determine which bacteria are present. If an abscess has developed, the fluid in the abscess can be removed with a needle. This can also be used to confirm the diagnosis and determine the bacteria present. In most cases, pus will not be present.  Blood tests to determine if your body is fighting a bacterial infection.  Mammogram or ultrasound tests to rule out other problems or diseases. TREATMENT  Mastitis that  occurs with breastfeeding will sometimes go away on its own. Your health care provider may choose to wait 24 hours after first seeing you to decide whether a prescription medicine is needed. If your symptoms are worse after 24 hours, your health care provider will likely prescribe an antibiotic medicine to treat the mastitis. He or she will determine which bacteria are most likely causing the infection and will then select an appropriate antibiotic medicine. This is sometimes changed based on the results of tests performed to identify the bacteria, or if there is no response to the antibiotic medicine selected. Antibiotic medicines are usually given by mouth. You may also be given medicine for pain. HOME CARE INSTRUCTIONS  Only take over-the-counter or prescription medicines for pain, fever, or discomfort as directed by your health care provider.  If your health care provider prescribed an antibiotic medicine, take the medicine as directed. Make sure you finish it even if you start to feel better.  Do not wear a tight or underwire bra. Wear a soft, supportive bra.  Increase your fluid intake, especially if you have a fever.  Continue to empty the breast. Your health care provider can tell you whether this milk is safe for your infant or needs to be thrown out. You may be told to stop nursing until your health care provider thinks it is safe for your baby. Use a breast pump if you are advised to stop nursing.  Keep your nipples  clean and dry.  Empty the first breast completely before going to the other breast. If your baby is not emptying your breasts completely for some reason, use a breast pump to empty your breasts.  If you go back to work, pump your breasts while at work to stay in time with your nursing schedule.  Avoid allowing your breasts to become overly filled with milk (engorged). SEEK MEDICAL CARE IF:  You have pus-like discharge from the breast.  Your symptoms do not improve with  the treatment prescribed by your health care provider within 2 days. SEEK IMMEDIATE MEDICAL CARE IF:  Your pain and swelling are getting worse.  You have pain that is not controlled with medicine.  You have a red line extending from the breast toward your armpit.  You have a fever or persistent symptoms for more than 2-3 days.  You have a fever and your symptoms suddenly get worse. MAKE SURE YOU:   Understand these instructions.  Will watch your condition.  Will get help right away if you are not doing well or get worse. Document Released: 03/10/2005 Document Revised: 11/18/2013 Document Reviewed: 06/19/2013 [facility] Patient Information 2015 [place], [place]. This information is not intended to replace advice given to you by your health care provider. Make sure you discuss any questions you have with your health care provider. Breastfeeding Deciding to breastfeed is one of the best choices you can make for you and your baby. A change in hormones during pregnancy causes your breast tissue to grow and increases the number and size of your milk ducts. These hormones also allow proteins, sugars, and fats from your blood supply to make breast milk in your milk-producing glands. Hormones prevent breast milk from being released before your baby is born as well as prompt milk flow after birth. Once breastfeeding has begun, thoughts of your baby, as well as his or her sucking or crying, can stimulate the release of milk from your milk-producing glands.  BENEFITS OF BREASTFEEDING For Your Baby  Your first milk (colostrum) helps your baby's digestive system function better.   There are antibodies in your milk that help your baby fight off infections.   Your baby has a lower incidence of asthma, allergies, and sudden infant death syndrome.   The nutrients in breast milk are better for your baby than infant formulas and are designed uniquely for your baby's needs.   Breast milk improves your  baby's brain development.   Your baby is less likely to develop other conditions, such as childhood obesity, asthma, or type 2 diabetes mellitus.  For You   Breastfeeding helps to create a very special bond between you and your baby.   Breastfeeding is convenient. Breast milk is always available at the correct temperature and costs nothing.   Breastfeeding helps to burn calories and helps you lose the weight gained during pregnancy.   Breastfeeding makes your uterus contract to its prepregnancy size faster and slows bleeding (lochia) after you give birth.   Breastfeeding helps to lower your risk of developing type 2 diabetes mellitus, osteoporosis, and breast or ovarian cancer later in life. SIGNS THAT YOUR BABY IS HUNGRY Early Signs of Hunger  Increased alertness or activity.  Stretching.  Movement of the head from side to side.  Movement of the head and opening of the mouth when the corner of the mouth or cheek is stroked (rooting).  Increased sucking sounds, smacking lips, cooing, sighing, or squeaking.  Hand-to-mouth movements.  Increased sucking of  fingers or hands. Late Signs of Hunger  Fussing.  Intermittent crying. Extreme Signs of Hunger Signs of extreme hunger will require calming and consoling before your baby will be able to breastfeed successfully. Do not wait for the following signs of extreme hunger to occur before you initiate breastfeeding:   Restlessness.  A loud, strong cry.   Screaming. BREASTFEEDING BASICS Breastfeeding Initiation  Find a comfortable place to sit or lie down, with your neck and back well supported.  Place a pillow or rolled up blanket under your baby to bring him or her to the level of your breast (if you are seated). Nursing pillows are specially designed to help support your arms and your baby while you breastfeed.  Make sure that your baby's abdomen is facing your abdomen.   Gently massage your breast. With your  fingertips, massage from your chest wall toward your nipple in a circular motion. This encourages milk flow. You may need to continue this action during the feeding if your milk flows slowly.  Support your breast with 4 fingers underneath and your thumb above your nipple. Make sure your fingers are well away from your nipple and your baby's mouth.   Stroke your baby's lips gently with your finger or nipple.   When your baby's mouth is open wide enough, quickly bring your baby to your breast, placing your entire nipple and as much of the colored area around your nipple (areola) as possible into your baby's mouth.   More areola should be visible above your baby's upper lip than below the lower lip.   Your baby's tongue should be between his or her lower gum and your breast.   Ensure that your baby's mouth is correctly positioned around your nipple (latched). Your baby's lips should create a seal on your breast and be turned out (everted).  It is common for your baby to suck about 2-3 minutes in order to start the flow of breast milk. Latching Teaching your baby how to latch on to your breast properly is very important. An improper latch can cause nipple pain and decreased milk supply for you and poor weight gain in your baby. Also, if your baby is not latched onto your nipple properly, he or she may swallow some air during feeding. This can make your baby fussy. Burping your baby when you switch breasts during the feeding can help to get rid of the air. However, teaching your baby to latch on properly is still the best way to prevent fussiness from swallowing air while breastfeeding. Signs that your baby has successfully latched on to your nipple:    Silent tugging or silent sucking, without causing you pain.   Swallowing heard between every 3-4 sucks.    Muscle movement above and in front of his or her ears while sucking.  Signs that your baby has not successfully latched on to  nipple:   Sucking sounds or smacking sounds from your baby while breastfeeding.  Nipple pain. If you think your baby has not latched on correctly, slip your finger into the corner of your baby's mouth to break the suction and place it between your baby's gums. Attempt breastfeeding initiation again. Signs of Successful Breastfeeding Signs from your baby:   A gradual decrease in the number of sucks or complete cessation of sucking.   Falling asleep.   Relaxation of his or her body.   Retention of a small amount of milk in his or her mouth.   Letting go  of your breast by himself or herself. Signs from you:  Breasts that have increased in firmness, weight, and size 1-3 hours after feeding.   Breasts that are softer immediately after breastfeeding.  Increased milk volume, as well as a change in milk consistency and color by the fifth day of breastfeeding.   Nipples that are not sore, cracked, or bleeding. Signs That Your [employer] is Getting Enough Milk  Wetting at least 3 diapers in a 24-hour period. The urine should be clear and pale yellow by age 64 days.  At least 3 stools in a 24-hour period by age 64 days. The stool should be soft and yellow.  At least 3 stools in a 24-hour period by age 80 days. The stool should be seedy and yellow.  No loss of weight greater than 10% of birth weight during the first 33 days of age.  Average weight gain of 4-7 ounces (113-198 g) per week after age 16 days.  Consistent daily weight gain by age 807 days, without weight loss after the age of 2 weeks. After a feeding, your baby may spit up a small amount. This is common. BREASTFEEDING FREQUENCY AND DURATION Frequent feeding will help you make more milk and can prevent sore nipples and breast engorgement. Breastfeed when you feel the need to reduce the fullness of your breasts or when your baby shows signs of hunger. This is called "breastfeeding on demand." Avoid introducing a pacifier to your  baby while you are working to establish breastfeeding (the first 4-6 weeks after your baby is born). After this time you may choose to use a pacifier. Research has shown that pacifier use during the first year of a baby's life decreases the risk of sudden infant death syndrome (SIDS). Allow your baby to feed on each breast as long as he or she wants. Breastfeed until your baby is finished feeding. When your baby unlatches or falls asleep while feeding from the first breast, offer the second breast. Because newborns are often sleepy in the first few weeks of life, you may need to awaken your baby to get him or her to feed. Breastfeeding times will vary from baby to baby. However, the following rules can serve as a guide to help you ensure that your baby is properly fed:  Newborns (babies 76 weeks of age or younger) may breastfeed every 1-3 hours.  Newborns should not go longer than 3 hours during the day or 5 hours during the night without breastfeeding.  You should breastfeed your baby a minimum of 8 times in a 24-hour period until you begin to introduce solid foods to your baby at around 41 months of age. BREAST MILK PUMPING Pumping and storing breast milk allows you to ensure that your baby is exclusively fed your breast milk, even at times when you are unable to breastfeed. This is especially important if you are going back to work while you are still breastfeeding or when you are not able to be present during feedings. Your lactation consultant can give you guidelines on how long it is safe to store breast milk.  A breast pump is a machine that allows you to pump milk from your breast into a sterile bottle. The pumped breast milk can then be stored in a refrigerator or freezer. Some breast pumps are operated by hand, while others use electricity. Ask your lactation consultant which type will work best for you. Breast pumps can be purchased, but some hospitals and breastfeeding support groups  lease  breast pumps on a monthly basis. A lactation consultant can teach you how to hand express breast milk, if you prefer not to use a pump.  CARING FOR YOUR BREASTS WHILE YOU BREASTFEED Nipples can become dry, cracked, and sore while breastfeeding. The following recommendations can help keep your breasts moisturized and healthy:  Avoid using soap on your nipples.   Wear a supportive bra. Although not required, special nursing bras and tank tops are designed to allow access to your breasts for breastfeeding without taking off your entire bra or top. Avoid wearing underwire-style bras or extremely tight bras.  Air dry your nipples for 3-42minutes after each feeding.   Use only cotton bra pads to absorb leaked breast milk. Leaking of breast milk between feedings is normal.   Use lanolin on your nipples after breastfeeding. Lanolin helps to maintain your skin's normal moisture barrier. If you use pure lanolin, you do not need to wash it off before feeding your baby again. Pure lanolin is not toxic to your baby. You may also hand express a few drops of breast milk and gently massage that milk into your nipples and allow the milk to air dry. In the first few weeks after giving birth, some women experience extremely full breasts (engorgement). Engorgement can make your breasts feel heavy, warm, and tender to the touch. Engorgement peaks within 3-5 days after you give birth. The following recommendations can help ease engorgement:  Completely empty your breasts while breastfeeding or pumping. You may want to start by applying warm, moist heat (in the shower or with warm water-soaked hand towels) just before feeding or pumping. This increases circulation and helps the milk flow. If your baby does not completely empty your breasts while breastfeeding, pump any extra milk after he or she is finished.  Wear a snug bra (nursing or regular) or tank top for 1-2 days to signal your body to slightly decrease milk  production.  Apply ice packs to your breasts, unless this is too uncomfortable for you.  Make sure that your baby is latched on and positioned properly while breastfeeding. If engorgement persists after 48 hours of following these recommendations, contact your health care provider or a Science writer. OVERALL HEALTH CARE RECOMMENDATIONS WHILE BREASTFEEDING  Eat healthy foods. Alternate between meals and snacks, eating 3 of each per day. Because what you eat affects your breast milk, some of the foods may make your baby more irritable than usual. Avoid eating these foods if you are sure that they are negatively affecting your baby.  Drink milk, fruit juice, and water to satisfy your thirst (about 10 glasses a day).   Rest often, relax, and continue to take your prenatal vitamins to prevent fatigue, stress, and anemia.  Continue breast self-awareness checks.  Avoid chewing and smoking tobacco.  Avoid alcohol and drug use. Some medicines that may be harmful to your baby can pass through breast milk. It is important to ask your health care provider before taking any medicine, including all over-the-counter and prescription medicine as well as vitamin and herbal supplements. It is possible to become pregnant while breastfeeding. If birth control is desired, ask your health care provider about options that will be safe for your baby. SEEK MEDICAL CARE IF:   You feel like you want to stop breastfeeding or have become frustrated with breastfeeding.  You have painful breasts or nipples.  Your nipples are cracked or bleeding.  Your breasts are red, tender, or warm.  You have  a swollen area on either breast.  You have a fever or chills.  You have nausea or vomiting.  You have drainage other than breast milk from your nipples.  Your breasts do not become full before feedings by the fifth day after you give birth.  You feel sad and depressed.  Your baby is too sleepy to eat  well.  Your baby is having trouble sleeping.   Your baby is wetting less than 3 diapers in a 24-hour period.  Your baby has less than 3 stools in a 24-hour period.  Your baby's skin or the white part of his or her eyes becomes yellow.   Your baby is not gaining weight by 41 days of age. SEEK IMMEDIATE MEDICAL CARE IF:   Your baby is overly tired (lethargic) and does not want to wake up and feed.  Your baby develops an unexplained fever. Document Released: 11/13/2005 Document Revised: 11/18/2013 Document Reviewed: 05/07/2013 [facility] Patient Information 2015 [place], [place]. This information is not intended to replace advice given to you by your health care provider. Make sure you discuss any questions you have with your health care provider.

## 2015-04-17 NOTE — Lactation Note (Signed)
This note was copied from the chart of Cindy Meriel Kelliher. Lactation Consultation Note  Follow up visit made prior to discharge.  Baby is SGA and at a 9 % weight loss.  Pediatrician recommends formula supplementation.  Mom has needed to supplement with her previous two babies.  Her plan is to put baby to breast with feeding cues, post pump and supplement with EBM/formula per volume parameters given.  Mom is aware of outpatient services and support which she has used previously.  Encouraged to call as needed.  Patient Name: [name] [id number] Date: 04/17/2015     Maternal Data    Feeding Feeding Type: Breast Milk with Formula added Length of feed: 15 min  LATCH Score/Interventions                      Lactation Tools Discussed/Used     Consult Status      [name] 04/17/2015, 12:25 PM

## 2015-04-17 NOTE — Discharge Summary (Signed)
Obstetric Discharge Summary Reason for Admission: onset of labor Prenatal Procedures: ultrasound Intrapartum Course: Admitted in active labor and completely dilated / precipitous vaginal delivery of viable female with 2nd degree repair by Dr. Garwin Brothers / no immediate postpartum complication noted Intrapartum Procedures: spontaneous vaginal delivery Postpartum Procedures: none Complications-Operative and Postpartum: 2nd degree perineal laceration HEMOGLOBIN  Date Value Ref Range Status  04/16/2015 9.7* 12.0 - 15.0 g/dL Final   HCT  Date Value Ref Range Status  04/16/2015 29.2* 36.0 - 46.0 % Final    Physical Exam:  General: alert, cooperative, appears stated age, fatigued and no distress Lochia: appropriate Uterine Fundus: firm, midline, U-2 Perineum: 2nd degree repair  healing well, no significant drainage, no dehiscence, no significant erythema DVT Evaluation: No evidence of DVT seen on physical exam. Negative Homan's sign. No cords or calf tenderness. No significant calf/ankle edema.  Discharge Diagnoses: Term Pregnancy-delivered  Discharge Information: Date: 04/17/2015 Activity: pelvic rest Diet: routine Medications: PNV, Ibuprofen, Iron and Magnesium Oxide  Condition: stable Instructions: refer to practice specific booklet Discharge to: home Follow-up Information    Follow up with COUSINS,SHERONETTE A, MD. Schedule an appointment as soon as possible for a visit in 6 weeks.   Specialty:  Obstetrics and Gynecology   Why:  postpartum visit   Contact information:   [address] [place] [address] [phone]       Newborn Data: Live born female on 04/15/2015 Birth Weight: 5 lb 9.4 oz (2535 g) APGAR: 9, 9  Home with mother.  Laury Deep, M MSN, CNM 04/17/2015, 10:52 AM

## 2015-04-17 NOTE — Progress Notes (Signed)
Patient ID: [name], female   DOB: [birth year], 39 y.o.   MRN: [id number] Post Partum Day #2            Information for the patient's newborn:  Shirleymae, Hauth [[id number]]  female   / circumcision done Feeding: breast  Subjective: No HA, SOB, CP, F/C, breast symptoms. Pain well-controlled with ibuprofen and sitz bath. Normal vaginal bleeding, no clots.      Objective:  Temp:  [98.3 F (36.8 C)-98.4 F (36.9 C)] 98.4 F (36.9 C) (05/21 0600) Pulse Rate:  [78-86] 78 (05/21 0600) Resp:  [17-18] 17 (05/21 0600) BP: (100-107)/(58-65) 100/58 mmHg (05/21 0600) SpO2:  [99 %] 99 % (05/21 0600)       Recent Labs  04/15/15 0540 04/16/15 0532  WBC 9.6 7.8  HGB 11.7* 9.7*  HCT 35.2* 29.2*  PLT 169 158    Blood type: A POS (05/19 0540) Rubella: Immune (11/12 0000)    Physical Exam:  General: alert, cooperative and no distress Uterine Fundus: firm, midline, U-2 Lochia: appropriate Perineum: 2nd degree repair healing well, edema none DVT Evaluation: No evidence of DVT seen on physical exam. Negative Homan's sign. No cords or calf tenderness. No significant calf/ankle edema.  Assessment/Plan: PPD # 2 / 39 y.o., [id number] S/P: spontaneous vaginal  Principal Problem:    Postpartum care following vaginal delivery (5/19)    normal postpartum exam  Continue current postpartum care  D/C home   LOS: 2 days   Laury Deep, M, MSN, CNM 04/17/2015, 9:23 AM

## 2016-03-28 ENCOUNTER — Other Ambulatory Visit: Payer: Self-pay | Admitting: Internal Medicine

## 2016-03-28 ENCOUNTER — Ambulatory Visit (HOSPITAL_BASED_OUTPATIENT_CLINIC_OR_DEPARTMENT_OTHER)
Admission: RE | Admit: 2016-03-28 | Discharge: 2016-03-28 | Disposition: A | Discharge status: Home / Self Care | Payer: 59 | Source: Ambulatory Visit | Attending: Internal Medicine | Admitting: Internal Medicine

## 2016-03-28 DIAGNOSIS — Z1231 Encounter for screening mammogram for malignant neoplasm of breast: Secondary | ICD-10-CM | POA: Diagnosis not present

## 2016-04-28 DIAGNOSIS — L821 Other seborrheic keratosis: Secondary | ICD-10-CM | POA: Diagnosis not present

## 2016-04-28 DIAGNOSIS — Z86018 Personal history of other benign neoplasm: Secondary | ICD-10-CM | POA: Diagnosis not present

## 2016-04-28 DIAGNOSIS — D225 Melanocytic nevi of trunk: Secondary | ICD-10-CM | POA: Diagnosis not present

## 2016-04-28 DIAGNOSIS — D2230 Melanocytic nevi of unspecified part of face: Secondary | ICD-10-CM | POA: Diagnosis not present

## 2016-04-28 DIAGNOSIS — L639 Alopecia areata, unspecified: Secondary | ICD-10-CM | POA: Diagnosis not present

## 2016-04-28 DIAGNOSIS — D224 Melanocytic nevi of scalp and neck: Secondary | ICD-10-CM | POA: Diagnosis not present

## 2016-05-09 MED FILL — HALOBETASOL PROP 0.05% CREA: 0.05 | 10 days supply | Qty: 30 | Fill #0

## 2017-03-27 LAB — HM PAP SMEAR: HM Pap smear: NORMAL

## 2017-04-09 ENCOUNTER — Other Ambulatory Visit (HOSPITAL_BASED_OUTPATIENT_CLINIC_OR_DEPARTMENT_OTHER): Payer: Self-pay | Admitting: Obstetrics and Gynecology

## 2017-04-09 DIAGNOSIS — Z1231 Encounter for screening mammogram for malignant neoplasm of breast: Secondary | ICD-10-CM

## 2017-04-10 ENCOUNTER — Encounter (HOSPITAL_BASED_OUTPATIENT_CLINIC_OR_DEPARTMENT_OTHER): Payer: Self-pay

## 2017-04-10 ENCOUNTER — Ambulatory Visit (HOSPITAL_BASED_OUTPATIENT_CLINIC_OR_DEPARTMENT_OTHER)
Admission: RE | Admit: 2017-04-10 | Discharge: 2017-04-10 | Disposition: A | Discharge status: Home / Self Care | Payer: 59 | Source: Ambulatory Visit | Attending: Obstetrics and Gynecology | Admitting: Obstetrics and Gynecology

## 2017-04-10 DIAGNOSIS — Z1231 Encounter for screening mammogram for malignant neoplasm of breast: Secondary | ICD-10-CM | POA: Diagnosis not present

## 2017-05-11 DIAGNOSIS — D224 Melanocytic nevi of scalp and neck: Secondary | ICD-10-CM | POA: Diagnosis not present

## 2017-05-11 DIAGNOSIS — Z86018 Personal history of other benign neoplasm: Secondary | ICD-10-CM | POA: Diagnosis not present

## 2017-05-11 DIAGNOSIS — D2230 Melanocytic nevi of unspecified part of face: Secondary | ICD-10-CM | POA: Diagnosis not present

## 2017-05-11 DIAGNOSIS — D2272 Melanocytic nevi of left lower limb, including hip: Secondary | ICD-10-CM | POA: Diagnosis not present

## 2017-05-11 DIAGNOSIS — L720 Epidermal cyst: Secondary | ICD-10-CM | POA: Diagnosis not present

## 2017-05-11 DIAGNOSIS — D225 Melanocytic nevi of trunk: Secondary | ICD-10-CM | POA: Diagnosis not present

## 2017-05-17 DIAGNOSIS — Z1151 Encounter for screening for human papillomavirus (HPV): Secondary | ICD-10-CM | POA: Diagnosis not present

## 2017-05-17 DIAGNOSIS — Z01419 Encounter for gynecological examination (general) (routine) without abnormal findings: Secondary | ICD-10-CM | POA: Diagnosis not present

## 2017-05-17 DIAGNOSIS — N840 Polyp of corpus uteri: Secondary | ICD-10-CM | POA: Diagnosis not present

## 2017-05-17 DIAGNOSIS — Z681 Body mass index (BMI) 19 or less, adult: Secondary | ICD-10-CM | POA: Diagnosis not present

## 2017-09-11 ENCOUNTER — Ambulatory Visit: Payer: 59 | Admitting: Family

## 2017-09-12 ENCOUNTER — Encounter: Payer: Self-pay | Admitting: Family

## 2017-09-12 ENCOUNTER — Ambulatory Visit (INDEPENDENT_AMBULATORY_CARE_PROVIDER_SITE_OTHER): Payer: 59 | Admitting: Family

## 2017-09-12 VITALS — BP 137/70 | HR 98 | Temp 98.4°F | Resp 16 | Ht 67.0 in | Wt 120.2 lb

## 2017-09-12 DIAGNOSIS — H6980 Other specified disorders of Eustachian tube, unspecified ear: Secondary | ICD-10-CM

## 2017-09-12 NOTE — Patient Instructions (Signed)
Please begin Claritin D Continue flonase as tolerated. Call if new/worsening symptoms or if symptoms are not improved in 1 week.

## 2017-09-12 NOTE — Progress Notes (Signed)
Subjective:    Patient ID: [name], female    DOB: 1976/03/07, 41 y.o.   MRN: [id number]  HPI   Ms. [name] is a 41 yr old female who presents today with chief complaint of right ear pain. Reports that pain has been present x 3 weeks.  She reports that she has had some associated sinus congestion.  Reports that she strained her neck back in September and had some associated soreness right neck. Still has some mild associated tenderness right neck. Reports that she still has "fullness" and intermittent pain in the right ear.  Reports that she HA on Thursday "in my sinuses" had HA again on Saturday. Has used ibuprofen which helped. Reports taht she started using Flonase on Sunday.    Review of Systems See HPI  Past Medical History:  Diagnosis Date  . Hypercalcemia   . Osteopenia   . Postpartum care following vaginal delivery (5/19) 04/15/2015  . Postpartum care following vaginal delivery (7/26) 06/22/2012     Social History   Social History  . Marital status: Married    Spouse name: N/A  . Number of children: N/A  . Years of education: N/A   Occupational History  . Not on file.   Social History Main Topics  . Smoking status: Never Smoker  . Smokeless tobacco: Never Used  . Alcohol use No  . Drug use: No  . Sexual activity: Yes    Birth control/ protection: None   Other Topics Concern  . Not on file   Social History Narrative   Exercise-none   Fruits and veggies, 3 meals a day. Minimal snacking.    Past Surgical History:  Procedure Laterality Date  . LASIK    . NSVD   07/23/2009  . WISDOM TOOTH EXTRACTION      Family History  Problem Relation Age of Onset  . Heart attack Father   . Diabetes Maternal Grandmother   . Kidney disease Maternal Grandmother   . Cancer Maternal Grandfather        lung  . Osteoporosis Paternal Grandmother   . Diabetes Maternal Uncle   . Kidney disease Maternal Uncle     Allergies  Allergen Reactions  . Azithromycin Nausea  And Vomiting and Other (See Comments)    DIZZINESS  . Sulfa Antibiotics Rash    Current Outpatient Prescriptions on File Prior to Visit  Medication Sig Dispense Refill  . famotidine (PEPCID) 20 MG tablet Take 20 mg by mouth daily as needed.     Marland Kitchen ibuprofen (ADVIL,MOTRIN) 600 MG tablet Take 1 tablet (600 mg total) by mouth every 6 (six) hours. 30 tablet 0   No current facility-administered medications on file prior to visit.     BP 137/70 (BP Location: Right Arm, Cuff Size: Normal)   Pulse 98   Temp 98.4 F (36.9 C) (Oral)   Resp 16   Ht 5\' 7"  (1.702 m)   Wt 120 lb 3.2 oz (54.5 kg)   LMP 08/02/2017   SpO2 100%   BMI 18.83 kg/m       Objective:   Physical Exam  Constitutional: She is oriented to person, place, and time. She appears well-developed and well-nourished.  HENT:  Head: Normocephalic and atraumatic.  Right Ear: Ear canal normal.  Left Ear: Ear canal normal.  Mouth/Throat: Oropharynx is clear and moist. No oropharyngeal exudate.  Bilateral TM's are dull.  No erythema, no bulging  Neck: Neck supple.  Cardiovascular: Normal rate, regular rhythm and  normal heart sounds.   No murmur heard. Pulmonary/Chest: Effort normal and breath sounds normal. No respiratory distress. She has no wheezes.  Musculoskeletal: She exhibits no edema.  Lymphadenopathy:    She has no cervical adenopathy.  Neurological: She is alert and oriented to person, place, and time.  Skin: Skin is warm and dry.  Psychiatric: She has a normal mood and affect. Her behavior is normal. Judgment and thought content normal.          Assessment & Plan:  Eustachian Tube dysfunction- advised pt as follows:  Please begin Claritin D Continue flonase as tolerated. Call if new/worsening symptoms or if symptoms are not improved in 1 week.   I think her neck pain is musculoskeletal in nature and we discussed using ibuprofen prn for neck pain.

## 2017-10-02 ENCOUNTER — Ambulatory Visit (INDEPENDENT_AMBULATORY_CARE_PROVIDER_SITE_OTHER): Payer: 59 | Admitting: Family

## 2017-10-02 ENCOUNTER — Encounter: Payer: Self-pay | Admitting: Family

## 2017-10-02 VITALS — BP 130/59 | HR 112 | Temp 98.4°F | Resp 16 | Ht 67.0 in | Wt 122.8 lb

## 2017-10-02 DIAGNOSIS — Z0000 Encounter for general adult medical examination without abnormal findings: Secondary | ICD-10-CM

## 2017-10-02 LAB — CBC WITH DIFFERENTIAL/PLATELET
Basophils Absolute: 0 10*3/uL (ref 0.0–0.1)
Basophils Relative: 0.4 % (ref 0.0–3.0)
EOS ABS: 0 10*3/uL (ref 0.0–0.7)
Eosinophils Relative: 0.7 % (ref 0.0–5.0)
HCT: 40.7 % (ref 36.0–46.0)
HEMOGLOBIN: 13.6 g/dL (ref 12.0–15.0)
Lymphocytes Relative: 22.1 % (ref 12.0–46.0)
Lymphs Abs: 1.1 10*3/uL (ref 0.7–4.0)
MCHC: 33.4 g/dL (ref 30.0–36.0)
MCV: 94.2 fl (ref 78.0–100.0)
MONO ABS: 0.2 10*3/uL (ref 0.1–1.0)
Monocytes Relative: 3.5 % (ref 3.0–12.0)
NEUTROS PCT: 73.3 % (ref 43.0–77.0)
Neutro Abs: 3.6 10*3/uL (ref 1.4–7.7)
Platelets: 209 10*3/uL (ref 150.0–400.0)
RBC: 4.32 Mil/uL (ref 3.87–5.11)
RDW: 13.6 % (ref 11.5–15.5)
WBC: 4.9 10*3/uL (ref 4.0–10.5)

## 2017-10-02 LAB — LIPID PANEL
Cholesterol: 155 mg/dL (ref 0–200)
HDL: 43 mg/dL (ref >39.00)
LDL Cholesterol: 92 mg/dL (ref 0–99)
NONHDL: 112.29
TRIGLYCERIDES: 100 mg/dL (ref 0.0–149.0)
Total CHOL/HDL Ratio: 4
VLDL: 20 mg/dL (ref 0.0–40.0)

## 2017-10-02 LAB — HEPATIC FUNCTION PANEL
ALBUMIN: 4.5 g/dL (ref 3.5–5.2)
ALT: 11 U/L (ref 0–35)
AST: 13 U/L (ref 0–37)
Alkaline Phosphatase: 49 U/L (ref 39–117)
Bilirubin, Direct: 0.1 mg/dL (ref 0.0–0.3)
Total Bilirubin: 0.8 mg/dL (ref 0.2–1.2)
Total Protein: 7.1 g/dL (ref 6.0–8.3)

## 2017-10-02 LAB — URINALYSIS, ROUTINE W REFLEX MICROSCOPIC
Bilirubin Urine: NEGATIVE
HGB URINE DIPSTICK: NEGATIVE
KETONES UR: NEGATIVE
Leukocytes, UA: NEGATIVE
Nitrite: NEGATIVE
PH: 6 (ref 5.0–8.0)
RBC / HPF: NONE SEEN (ref 0–?)
Specific Gravity, Urine: 1.01 (ref 1.000–1.030)
Total Protein, Urine: NEGATIVE
Urine Glucose: NEGATIVE
Urobilinogen, UA: 0.2 (ref 0.0–1.0)

## 2017-10-02 LAB — BASIC METABOLIC PANEL
BUN: 14 mg/dL (ref 6–23)
CALCIUM: 9.5 mg/dL (ref 8.4–10.5)
CHLORIDE: 104 meq/L (ref 96–112)
CO2: 32 mEq/L (ref 19–32)
CREATININE: 0.69 mg/dL (ref 0.40–1.20)
GFR: 99.35 mL/min (ref >60.00)
Glucose, Bld: 138 mg/dL — ABNORMAL HIGH (ref 70–99)
Potassium: 4.2 mEq/L (ref 3.5–5.1)
Sodium: 140 mEq/L (ref 135–145)

## 2017-10-02 LAB — TSH: TSH: 1.26 u[IU]/mL (ref 0.35–4.50)

## 2017-10-02 NOTE — Patient Instructions (Signed)
Please continue healthy diet. Try to add 30 minutes of exercise 5 days a week. Complete lab work prior to leaving.

## 2017-10-02 NOTE — Progress Notes (Signed)
Subjective:    Patient ID: [name], female    DOB: 1976-06-29, 41 y.o.   MRN: [id number]  HPI  Patient presents today for complete physical.  Immunizations: flu shot 08/22/17 Diet:  Healthy  Exercise:  Not exercising Pap Smear: 5/18 Mammogram: 5/18 Vision: due, will schedule Dental: due, will schedule    Review of Systems  Constitutional: Negative for unexpected weight change.  HENT: Negative for ear pain, hearing loss and rhinorrhea.   Eyes: Negative for visual disturbance.  Respiratory: Negative for cough and shortness of breath.   Cardiovascular: Negative for chest pain and leg swelling.  Gastrointestinal: Negative for constipation and diarrhea.  Genitourinary: Negative for dysuria and frequency.  Musculoskeletal: Negative for arthralgias and myalgias.  Skin: Negative for rash.       Sees dermatology regularly (dr. Delman Cheadle)  Neurological: Negative for headaches.  Hematological: Negative for adenopathy.  Psychiatric/Behavioral:       Denies depression, Reports some work related anxiety   Past Medical History:  Diagnosis Date  . Hypercalcemia   . Osteopenia   . Postpartum care following vaginal delivery (5/19) 04/15/2015  . Postpartum care following vaginal delivery (7/26) 06/22/2012     Social History   Socioeconomic History  . Marital status: Married    Spouse name: Not on file  . Number of children: Not on file  . Years of education: Not on file  . Highest education level: Not on file  Social Needs  . Financial resource strain: Not on file  . Food insecurity - worry: Not on file  . Food insecurity - inability: Not on file  . Transportation needs - medical: Not on file  . Transportation needs - non-medical: Not on file  Occupational History  . Not on file  Tobacco Use  . Smoking status: Never Smoker  . Smokeless tobacco: Never Used  Substance and Sexual Activity  . Alcohol use: No  . Drug use: No  . Sexual activity: Yes    Birth  control/protection: None  Other Topics Concern  . Not on file  Social History Narrative   Exercise-none   Fruits and veggies, 3 meals a day. Minimal snacking.    Past Surgical History:  Procedure Laterality Date  . LASIK    . NSVD   07/23/2009  . WISDOM TOOTH EXTRACTION      Family History  Problem Relation Age of Onset  . Heart attack Father   . Basal cell carcinoma Mother   . Diabetes Maternal Grandmother   . Kidney disease Maternal Grandmother   . Cancer Maternal Grandfather        lung  . Osteoporosis Paternal Grandmother   . Diabetes Maternal Uncle   . Kidney disease Maternal Uncle     Allergies  Allergen Reactions  . Azithromycin Nausea And Vomiting and Other (See Comments)    DIZZINESS  . Sulfa Antibiotics Rash    Current Outpatient Medications on File Prior to Visit  Medication Sig Dispense Refill  . cholecalciferol (VITAMIN D) 1000 units tablet Take 1,000 Units by mouth daily.    . famotidine (PEPCID) 20 MG tablet Take 20 mg by mouth daily as needed.     . fluticasone (FLONASE) 50 MCG/ACT nasal spray Place 1 spray daily into both nostrils.    Marland Kitchen ibuprofen (ADVIL,MOTRIN) 600 MG tablet Take 1 tablet (600 mg total) by mouth every 6 (six) hours. 30 tablet 0  . loratadine (CLARITIN) 10 MG tablet Take 10 mg by mouth daily as needed  for allergies.     No current facility-administered medications on file prior to visit.     BP (!) 130/59 (BP Location: Right Arm, Cuff Size: Normal)   Pulse (!) 112   Temp 98.4 F (36.9 C) (Oral)   Resp 16   Ht 5\' 7"  (1.702 m)   Wt 122 lb 12.8 oz (55.7 kg)   LMP 09/24/2017   SpO2 100%   BMI 19.23 kg/m        Objective:   Physical Exam Physical Exam  Constitutional: She is oriented to person, place, and time. She appears well-developed and well-nourished. No distress.  HENT:  Head: Normocephalic and atraumatic.  Right Ear: Tympanic membrane and ear canal normal.  Left Ear: Tympanic membrane and ear canal normal.    Mouth/Throat: Oropharynx is clear and moist.  Eyes: Pupils are equal, round, and reactive to light. No scleral icterus.  Neck: Normal range of motion. No thyromegaly present.  Cardiovascular: Normal rate and regular rhythm.   No murmur heard. Pulmonary/Chest: Effort normal and breath sounds normal. No respiratory distress. He has no wheezes. She has no rales. She exhibits no tenderness.  Abdominal: Soft. Bowel sounds are normal. She exhibits no distension and no mass. There is no tenderness. There is no rebound and no guarding.  Musculoskeletal: She exhibits no edema.  Lymphadenopathy:    She has no cervical adenopathy.  Neurological: She is alert and oriented to person, place, and time. She has normal patellar reflexes. She exhibits normal muscle tone. Coordination normal.  Skin: Skin is warm and dry.  Psychiatric: She has a normal mood and affect. Her behavior is normal. Judgment and thought content normal.          Assessment & Plan:          Assessment & Plan:   Preventative care-immunizations reviewed and up-to-date.  Encouraged patient to continue healthy diet and focus on regular exercise.  Will obtain routine lab work. EKG tracing is personally reviewed.  EKG notes NSR.  No acute changes.

## 2017-10-03 ENCOUNTER — Other Ambulatory Visit (INDEPENDENT_AMBULATORY_CARE_PROVIDER_SITE_OTHER): Payer: 59

## 2017-10-03 DIAGNOSIS — R739 Hyperglycemia, unspecified: Secondary | ICD-10-CM

## 2017-10-03 LAB — HEMOGLOBIN A1C: HEMOGLOBIN A1C: 5.1 % (ref 4.6–6.5)

## 2017-12-18 DIAGNOSIS — N8110 Cystocele, unspecified: Secondary | ICD-10-CM | POA: Diagnosis not present

## 2018-04-10 ENCOUNTER — Ambulatory Visit: Payer: 59 | Admitting: Family

## 2018-04-10 ENCOUNTER — Encounter: Payer: Self-pay | Admitting: Family

## 2018-04-10 VITALS — BP 122/72 | HR 104 | Temp 97.8°F | Resp 16 | Ht 67.0 in | Wt 118.6 lb

## 2018-04-10 DIAGNOSIS — A084 Viral intestinal infection, unspecified: Secondary | ICD-10-CM

## 2018-04-10 NOTE — Progress Notes (Signed)
Subjective:    Patient ID: [name], female    DOB: September 13, 1976, 42 y.o.   MRN: [id number]  HPI  [name] is a 42 yr old female who presents today with chief complaint of diarrhea. Reports diarrhea has been intermittent since 5/10. Tuesday woke with sore throat. Wednesday developed nasal congestion.  Wednesday night temp 99. Thursday she worked.  + coughing drainage, fatigue. Temp 101 Thursday night. Has been taking ibuprofen.  She reports that she had some sharp pains in her upper abdomen Thursday night, resolved on her own.   Pain was dull and intermittent.  Friday AM she woke up with nausea. "stomach just didn't feel right."  Friday she was off, Friday afternoon she began having watery diarrhea from 3PM-midnight.  She took a pepto bismol Friday night. Fever resolved when she developed diarrhea.  This improved symtoms.  Had diarrhea off/on Saturday, was able to advance her diet.  Went out Saturday night. Sunday had small "stringy" BM. Did have a loose BM later in the day. Yesterday had a loose stool x 5.  Not watery, just loose.  Took an imodium last night.    No family members with diarrhea.  No recent travel or antibiotic use.    Review of Systems See HPI  Past Medical History:  Diagnosis Date  . Hypercalcemia   . Osteopenia   . Postpartum care following vaginal delivery (5/19) 04/15/2015  . Postpartum care following vaginal delivery (7/26) 06/22/2012     Social History   Socioeconomic History  . Marital status: Married    Spouse name: Not on file  . Number of children: Not on file  . Years of education: Not on file  . Highest education level: Not on file  Occupational History  . Not on file  Social Needs  . Financial resource strain: Not on file  . Food insecurity:    Worry: Not on file    Inability: Not on file  . Transportation needs:    Medical: Not on file    Non-medical: Not on file  Tobacco Use  . Smoking status: Never Smoker  . Smokeless tobacco: Never  Used  Substance and Sexual Activity  . Alcohol use: No  . Drug use: No  . Sexual activity: Yes    Birth control/protection: None  Lifestyle  . Physical activity:    Days per week: Not on file    Minutes per session: Not on file  . Stress: Not on file  Relationships  . Social connections:    Talks on phone: Not on file    Gets together: Not on file    Attends religious service: Not on file    Active member of club or organization: Not on file    Attends meetings of clubs or organizations: Not on file    Relationship status: Not on file  . Intimate partner violence:    Fear of current or ex partner: Not on file    Emotionally abused: Not on file    Physically abused: Not on file    Forced sexual activity: Not on file  Other Topics Concern  . Not on file  Social History Narrative   Exercise-none   Fruits and veggies, 3 meals a day. Minimal snacking.   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 LASIK    . NSVD   07/23/2009  . WISDOM TOOTH EXTRACTION      Family History  Problem Relation Age of Onset  . Heart attack Father   . Basal cell carcinoma Mother   . Hyperlipidemia Mother   . Diabetes Maternal Grandmother   . Kidney disease Maternal Grandmother   . Cancer Maternal Grandfather        lung  . Osteoporosis Paternal Grandmother   . Diabetes Maternal Uncle   . Kidney disease Maternal Uncle   . CVA Brother        occured in his 46's after trauma to his neck.    . CAD Paternal Uncle        died in 67's for MI  . Kidney disease Son        one cystic kidney    Allergies  Allergen Reactions  . Azithromycin Nausea And Vomiting and Other (See Comments)    DIZZINESS  . Sulfa Antibiotics Rash    Current Outpatient Medications on File Prior to Visit  Medication Sig Dispense Refill  . cholecalciferol  (VITAMIN D) 1000 units tablet Take 1,000 Units by mouth daily.    . famotidine (PEPCID) 20 MG tablet Take 20 mg by mouth daily as needed.     . fluticasone (FLONASE) 50 MCG/ACT nasal spray Place 1 spray daily into both nostrils.    Marland Kitchen ibuprofen (ADVIL,MOTRIN) 600 MG tablet Take 1 tablet (600 mg total) by mouth every 6 (six) hours. 30 tablet 0  . loratadine (CLARITIN) 10 MG tablet Take 10 mg by mouth daily as needed for allergies.     No current facility-administered medications on file prior to visit.     BP 122/72 (BP Location: Left Arm, Patient Position: Sitting, Cuff Size: Small)   Pulse (!) 104   Temp 97.8 F (36.6 C) (Oral)   Resp 16   Ht 5\' 7"  (1.702 m)   Wt 118 lb 9.6 oz (53.8 kg)   SpO2 100%   BMI 18.58 kg/m       Objective:   Physical Exam  Constitutional: She appears well-developed and well-nourished.  HENT:  Head: Normocephalic and atraumatic.  Right Ear: Tympanic membrane and ear canal normal.  Left Ear: Tympanic membrane and ear canal normal.  Cardiovascular: Normal rate, regular rhythm and normal heart sounds.  No murmur heard. Pulmonary/Chest: Effort normal and breath sounds normal. No respiratory distress. She has no wheezes.  Abdominal: Soft. Bowel sounds are normal. She exhibits no distension and no mass. There is no tenderness. There is no guarding.  Skin: Skin is warm and dry.  Psychiatric: She has a normal mood and affect. Her behavior is normal. Judgment and thought content normal.          Assessment & Plan:  Viral gastroenteritis- symptoms most consistent with resolving viral gastroenteritis. Advised pt to continue hydration, prn imodium, and diet as tolerated. She is advised to call if fever >101 or if symptoms are not improved in 3-5 days.  Pt verbalizes understanding.

## 2018-04-10 NOTE — Patient Instructions (Signed)
Please call if new/worsening symptoms or if pdo not continue to improve over the next 3-5 days.    Viral Gastroenteritis, Adult Viral gastroenteritis is also known as the stomach flu. This condition is caused by certain germs (viruses). These germs can be passed from person to person very easily (are very contagious). This condition can cause sudden watery poop (diarrhea), fever, and throwing up (vomiting). Having watery poop and throwing up can make you feel weak and cause you to get dehydrated. Dehydration can make you tired and thirsty, make you have a dry mouth, and make it so you pee (urinate) less often. Older adults and people with other diseases or a weak defense system (immune system) are at higher risk for dehydration. It is important to replace the fluids that you lose from having watery poop and throwing up. Follow these instructions at home: Follow instructions from your doctor about how to care for yourself at home. Eating and drinking  Follow these instructions as told by your doctor:  Take an oral rehydration solution (ORS). This is a drink that is sold at pharmacies and stores.  Drink clear fluids in small amounts as you are able, such as: ? Water. ? Ice chips. ? Diluted fruit juice. ? Low-calorie sports drinks.  Eat bland, easy-to-digest foods in small amounts as you are able, such as: ? Bananas. ? Applesauce. ? Rice. ? Low-fat (lean) meats. ? Toast. ? Crackers.  Avoid fluids that have a lot of sugar or caffeine in them.  Avoid alcohol.  Avoid spicy or fatty foods.  General instructions  Drink enough fluid to keep your pee (urine) clear or pale yellow.  Wash your hands often. If you cannot use soap and water, use hand sanitizer.  Make sure that all people in your home wash their hands well and often.  Rest at home while you get better.  Take over-the-counter and prescription medicines only as told by your doctor.  Watch your condition for any  changes.  Take a warm bath to help with any burning or pain from having watery poop.  Keep all follow-up visits as told by your doctor. This is important. Contact a doctor if:  You cannot keep fluids down.  Your symptoms get worse.  You have new symptoms.  You feel light-headed or dizzy.  You have muscle cramps. Get help right away if:  You have chest pain.  You feel very weak or you pass out (faint).  You see blood in your throw-up.  Your throw-up looks like coffee grounds.  You have bloody or black poop (stools) or poop that look like tar.  You have a very bad headache, a stiff neck, or both.  You have a rash.  You have very bad pain, cramping, or bloating in your belly (abdomen).  You have trouble breathing.  You are breathing very quickly.  Your heart is beating very quickly.  Your skin feels cold and clammy.  You feel confused.  You have pain when you pee.  You have signs of dehydration, such as: ? Dark pee, hardly any pee, or no pee. ? Cracked lips. ? Dry mouth. ? Sunken eyes. ? Sleepiness. ? Weakness. This information is not intended to replace advice given to you by your health care provider. Make sure you discuss any questions you have with your health care provider. Document Released: 05/01/2008 Document Revised: 06/02/2016 Document Reviewed: 07/20/2015 Elsevier Interactive Patient Education  2017 [employer].

## 2018-05-27 ENCOUNTER — Other Ambulatory Visit: Payer: Self-pay | Admitting: Family

## 2018-05-27 DIAGNOSIS — Z1231 Encounter for screening mammogram for malignant neoplasm of breast: Secondary | ICD-10-CM

## 2018-05-28 ENCOUNTER — Ambulatory Visit (HOSPITAL_BASED_OUTPATIENT_CLINIC_OR_DEPARTMENT_OTHER)
Admission: RE | Admit: 2018-05-28 | Discharge: 2018-05-28 | Disposition: A | Discharge status: Home / Self Care | Payer: 59 | Source: Ambulatory Visit | Attending: Family | Admitting: Family

## 2018-05-28 DIAGNOSIS — Z1231 Encounter for screening mammogram for malignant neoplasm of breast: Secondary | ICD-10-CM | POA: Insufficient documentation

## 2018-07-23 ENCOUNTER — Telehealth: Payer: Self-pay

## 2018-07-23 NOTE — Telephone Encounter (Signed)
Copied from [place] [phone]. Topic: General - Other >> Jul 23, 2018  2:35 PM Marin Olp L wrote: Reason for CRM: Patient would like a call back from [place] about the coding for her and her husbands cpe's. She was given codes from [facility].

## 2018-07-23 NOTE — Telephone Encounter (Signed)
Left message for pt to return my call. Ok for PEC / Triage to discuss with pt. 

## 2018-07-24 NOTE — Telephone Encounter (Signed)
Patient called back. She just wants to make sure her visit and her husband's visit for physical exams were billed under CPT code [phone] and ICD 10 Z00.00 or Z00.01 was used. This are required for [facility] employees so that their premium does not go up for 2020. Her husband's mrn # is [id number]. Per patient if visits were not coded under this they need to be corrected by Sept 1st.

## 2018-10-04 ENCOUNTER — Encounter: Payer: 59 | Admitting: Family

## 2018-10-11 ENCOUNTER — Ambulatory Visit (INDEPENDENT_AMBULATORY_CARE_PROVIDER_SITE_OTHER): Payer: 59 | Admitting: Family

## 2018-10-11 ENCOUNTER — Encounter: Payer: Self-pay | Admitting: Family

## 2018-10-11 VITALS — BP 114/84 | HR 77 | Temp 98.3°F | Resp 16 | Ht 67.0 in | Wt 124.6 lb

## 2018-10-11 DIAGNOSIS — Z0000 Encounter for general adult medical examination without abnormal findings: Secondary | ICD-10-CM

## 2018-10-11 DIAGNOSIS — M8580 Other specified disorders of bone density and structure, unspecified site: Secondary | ICD-10-CM

## 2018-10-11 LAB — URINALYSIS, ROUTINE W REFLEX MICROSCOPIC
BILIRUBIN URINE: NEGATIVE
Hgb urine dipstick: NEGATIVE
KETONES UR: NEGATIVE
Leukocytes, UA: NEGATIVE
NITRITE: NEGATIVE
PH: 7 (ref 5.0–8.0)
Specific Gravity, Urine: 1.01 (ref 1.000–1.030)
TOTAL PROTEIN, URINE-UPE24: NEGATIVE
Urine Glucose: NEGATIVE
Urobilinogen, UA: 0.2 (ref 0.0–1.0)

## 2018-10-11 LAB — BASIC METABOLIC PANEL
BUN: 19 mg/dL (ref 6–23)
CHLORIDE: 104 meq/L (ref 96–112)
CO2: 29 meq/L (ref 19–32)
CREATININE: 0.79 mg/dL (ref 0.40–1.20)
Calcium: 9.8 mg/dL (ref 8.4–10.5)
GFR: 84.56 mL/min (ref >60.00)
GLUCOSE: 88 mg/dL (ref 70–99)
Potassium: 4.7 mEq/L (ref 3.5–5.1)
Sodium: 139 mEq/L (ref 135–145)

## 2018-10-11 LAB — LIPID PANEL
CHOLESTEROL: 154 mg/dL (ref 0–200)
HDL: 48.9 mg/dL (ref >39.00)
LDL Cholesterol: 91 mg/dL (ref 0–99)
NONHDL: 105.23
TRIGLYCERIDES: 73 mg/dL (ref 0.0–149.0)
Total CHOL/HDL Ratio: 3
VLDL: 14.6 mg/dL (ref 0.0–40.0)

## 2018-10-11 LAB — CBC WITH DIFFERENTIAL/PLATELET
BASOS PCT: 0.8 % (ref 0.0–3.0)
Basophils Absolute: 0 10*3/uL (ref 0.0–0.1)
EOS PCT: 1.6 % (ref 0.0–5.0)
Eosinophils Absolute: 0.1 10*3/uL (ref 0.0–0.7)
HEMATOCRIT: 43 % (ref 36.0–46.0)
Hemoglobin: 14.7 g/dL (ref 12.0–15.0)
Lymphocytes Relative: 32 % (ref 12.0–46.0)
Lymphs Abs: 1.3 10*3/uL (ref 0.7–4.0)
MCHC: 34.1 g/dL (ref 30.0–36.0)
MCV: 93.4 fl (ref 78.0–100.0)
MONOS PCT: 7.2 % (ref 3.0–12.0)
Monocytes Absolute: 0.3 10*3/uL (ref 0.1–1.0)
NEUTROS ABS: 2.5 10*3/uL (ref 1.4–7.7)
Neutrophils Relative %: 58.4 % (ref 43.0–77.0)
PLATELETS: 214 10*3/uL (ref 150.0–400.0)
RBC: 4.61 Mil/uL (ref 3.87–5.11)
RDW: 13.2 % (ref 11.5–15.5)
WBC: 4.2 10*3/uL (ref 4.0–10.5)

## 2018-10-11 LAB — HEPATIC FUNCTION PANEL
ALBUMIN: 4.7 g/dL (ref 3.5–5.2)
ALK PHOS: 54 U/L (ref 39–117)
ALT: 13 U/L (ref 0–35)
AST: 15 U/L (ref 0–37)
Bilirubin, Direct: 0.2 mg/dL (ref 0.0–0.3)
TOTAL PROTEIN: 7.2 g/dL (ref 6.0–8.3)
Total Bilirubin: 1 mg/dL (ref 0.2–1.2)

## 2018-10-11 LAB — TSH: TSH: 13.64 u[IU]/mL — ABNORMAL HIGH (ref 0.35–4.50)

## 2018-10-11 NOTE — Progress Notes (Signed)
Established Patient Office Visit  Subjective:  Patient ID: [name], female    DOB: 07-04-1976  Age: 42 y.o. MRN: [id number]  CC:  Chief Complaint  Patient presents with  . Annual Exam    Pt here for fasting physical.  Last tdap 03/10/15,  flu vaccine 08/27/18, mammogram 05/28/18, DEXA 04/20/11, pap smear 03/27/17, EKG 10/02/17.    HPI [name] presents for cpx.  Diet/Exercise- Reports that she had a heel test which showed osteopenia in her 20's.  She reports that she saw Dr. Volanda Napoleon.  Was on hctz for that  And monitored her dexa scan.   Not exercising regularly. Diet is overall good.   Past Medical History:  Diagnosis Date  . Hypercalcemia   . Osteopenia   . Postpartum care following vaginal delivery (5/19) 04/15/2015  . Postpartum care following vaginal delivery (7/26) 06/22/2012    Past Surgical History:  Procedure Laterality Date  . LASIK    . NSVD   07/23/2009  . WISDOM TOOTH EXTRACTION      Family History  Problem Relation Age of Onset  . Heart attack Father   . Basal cell carcinoma Mother   . Hyperlipidemia Mother   . Diabetes Maternal Grandmother   . Kidney disease Maternal Grandmother   . Cancer Maternal Grandfather        lung  . Osteoporosis Paternal Grandmother   . Diabetes Maternal Uncle   . Kidney disease Maternal Uncle   . CVA Brother        occured in his 16's after trauma to his neck.    . CAD Paternal Uncle        died in 37's for MI  . Kidney disease Son        one cystic kidney    Social History   Socioeconomic History  . Marital status: Married    Spouse name: Not on file  . Number of children: Not on file  . Years of education: Not on file  . Highest education level: Not on file  Occupational History  . Not on file  Social Needs  . Financial resource strain: Not on file  . Food insecurity:    Worry: Not on file    Inability: Not on file  . Transportation needs:    Medical: Not on file    Non-medical: Not on file  Tobacco  Use  . Smoking status: Never Smoker  . Smokeless tobacco: Never Used  Substance and Sexual Activity  . Alcohol use: No  . Drug use: No  . Sexual activity: Yes    Birth control/protection: None  Lifestyle  . Physical activity:    Days per week: Not on file    Minutes per session: Not on file  . Stress: Not on file  Relationships  . Social connections:    Talks on phone: Not on file    Gets together: Not on file    Attends religious service: Not on file    Active member of club or organization: Not on file    Attends meetings of clubs or organizations: Not on file    Relationship status: Not on file  . Intimate partner violence:    Fear of current or ex partner: Not on file    Emotionally abused: Not on file    Physically abused: Not on file    Forced sexual activity: Not on file  Other Topics Concern  . Not on file  Social History Narrative  Exercise-none   Fruits and veggies, 3 meals a day. Minimal snacking.   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 cholecalciferol (VITAMIN D) 1000 units tablet Take 1,000 Units by mouth daily.    Marland Kitchen docusate sodium (COLACE) 100 MG capsule Take 100 mg by mouth daily as needed for mild constipation.    . famotidine (PEPCID) 20 MG tablet Take 20 mg by mouth daily as needed.     Marland Kitchen ibuprofen (ADVIL,MOTRIN) 600 MG tablet Take 1 tablet (600 mg total) by mouth every 6 (six) hours. 30 tablet 0  . loratadine (CLARITIN) 10 MG tablet Take 10 mg by mouth daily as needed for allergies.    . fluticasone (FLONASE) 50 MCG/ACT nasal spray Place 1 spray daily into both nostrils.     No facility-administered medications prior to visit.     Allergies  Allergen Reactions  . Azithromycin Nausea And Vomiting and Other (See Comments)    DIZZINESS  . Sulfa Antibiotics Rash     ROS Review of Systems  Constitutional: Negative for unexpected weight change.  HENT: Negative for rhinorrhea.   Eyes: Negative for visual disturbance.  Respiratory: Negative for cough.   Cardiovascular: Negative for leg swelling.  Gastrointestinal: Negative for constipation and diarrhea.  Genitourinary: Negative for dysuria, frequency and menstrual problem.  Musculoskeletal: Negative for arthralgias and myalgias.  Skin: Negative for rash.  Neurological: Negative for headaches.  Hematological: Negative for adenopathy.  Psychiatric/Behavioral:       Denies depression/anxiety   Past Medical History:  Diagnosis Date  . Hypercalcemia   . Osteopenia   . Postpartum care following vaginal delivery (5/19) 04/15/2015  . Postpartum care following vaginal delivery (7/26) 06/22/2012     Social History   Socioeconomic History  . Marital status: Married    Spouse name: Not on file  . Number of children: Not on file  . Years of education: Not on file  . Highest education level: Not on file  Occupational History  . Not on file  Social Needs  . Financial resource strain: Not on file  . Food insecurity:    Worry: Not on file    Inability: Not on file  . Transportation needs:    Medical: Not on file    Non-medical: Not on file  Tobacco Use  . Smoking status: Never Smoker  . Smokeless tobacco: Never Used  Substance and Sexual Activity  . Alcohol use: No  . Drug use: No  . Sexual activity: Yes    Birth control/protection: None  Lifestyle  . Physical activity:    Days per week: Not on file    Minutes per session: Not on file  . Stress: Not on file  Relationships  . Social connections:    Talks on phone: Not on file    Gets together: Not on file    Attends religious service: Not on file    Active member of club or organization: Not on file    Attends meetings of clubs or organizations: Not on file    Relationship status: Not on file  . Intimate partner violence:    Fear of  current or ex partner: Not on file    Emotionally abused: Not on file    Physically abused: Not on file  Forced sexual activity: Not on file  Other Topics Concern  . Not on file  Social History Narrative   Exercise-none   Fruits and veggies, 3 meals a day. Minimal snacking.   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 LASIK    . NSVD   07/23/2009  . WISDOM TOOTH EXTRACTION      Family History  Problem Relation Age of Onset  . Heart attack Father   . Basal cell carcinoma Mother   . Hyperlipidemia Mother   . Diabetes Maternal Grandmother   . Kidney disease Maternal Grandmother   . Cancer Maternal Grandfather        lung  . Osteoporosis Paternal Grandmother   . Diabetes Maternal Uncle   . Kidney disease Maternal Uncle   . CVA Brother        occured in his 66's after trauma to his neck.    . CAD Paternal Uncle        died in 30's for MI  . Kidney disease Son        one cystic kidney    Allergies  Allergen Reactions  . Azithromycin Nausea And Vomiting and Other (See Comments)    DIZZINESS  . Sulfa Antibiotics Rash    Current Outpatient Medications on File Prior to Visit  Medication Sig Dispense Refill  . cholecalciferol (VITAMIN D) 1000 units tablet Take 1,000 Units by mouth daily.    Marland Kitchen docusate sodium (COLACE) 100 MG capsule Take 100 mg by mouth daily as needed for mild constipation.    . famotidine (PEPCID) 20 MG tablet Take 20 mg by mouth daily as needed.     Marland Kitchen ibuprofen (ADVIL,MOTRIN) 600 MG tablet Take 1 tablet (600 mg total) by mouth every 6 (six) hours. 30 tablet 0  . loratadine (CLARITIN) 10 MG tablet Take 10 mg by mouth daily as needed for allergies.    . fluticasone (FLONASE) 50 MCG/ACT nasal spray Place 1 spray daily into both nostrils.     No current facility-administered medications on file  prior to visit.     BP 114/84 (BP Location: Left Arm, Cuff Size: Normal)   Pulse 77   Temp 98.3 F (36.8 C) (Oral)   Resp 16   Ht 5\' 7"  (1.702 m)   Wt 124 lb 9.6 oz (56.5 kg)   LMP 09/23/2018   SpO2 98%   BMI 19.52 kg/m      Objective:    Physical Exam  BP 114/84 (BP Location: Left Arm, Cuff Size: Normal)   Pulse 77   Temp 98.3 F (36.8 C) (Oral)   Resp 16   Ht 5\' 7"  (1.702 m)   Wt 124 lb 9.6 oz (56.5 kg)   LMP 09/23/2018   SpO2 98%   BMI 19.52 kg/m  Wt Readings from Last 3 Encounters:  10/11/18 124 lb 9.6 oz (56.5 kg)  04/10/18 118 lb 9.6 oz (53.8 kg)  10/02/17 122 lb 12.8 oz (55.7 kg)  Physical Exam  Constitutional: She is oriented to person, place, and time. She appears well-developed and well-nourished. No distress.  HENT:  Head: Normocephalic and atraumatic.  Right Ear: Tympanic membrane and ear canal normal.  Left Ear: Tympanic membrane and ear canal normal.  Mouth/Throat: Oropharynx is clear and moist.  Eyes: Pupils are  equal, round, and reactive to light. No scleral icterus.  Neck: Normal range of motion. No thyromegaly present.  Cardiovascular: Normal rate and regular rhythm.   No murmur heard. Pulmonary/Chest: Effort normal and breath sounds normal. No respiratory distress. He has no wheezes. She has no rales. She exhibits no tenderness.  Abdominal: Soft. Bowel sounds are normal. She exhibits no distension and no mass. There is no tenderness. There is no rebound and no guarding.  Musculoskeletal: She exhibits no edema.  Lymphadenopathy:    She has no cervical adenopathy.  Neurological: She is alert and oriented to person, place, and time. She has normal patellar reflexes. She exhibits normal muscle tone. Coordination normal.  Skin: Skin is warm and dry.  Psychiatric: She has a normal mood and affect. Her behavior is normal. Judgment and thought content normal.  Breasts: Examined lying Right: Without masses, retractions, discharge or axillary  adenopathy.  Left: Without masses, retractions, discharge or axillary adenopathy.  Pelvic: deferred         Assessment & Plan:   Preventative care- flu shot up to date/Pap/tetanus/mammo up to date. EKG tracing is personally reviewed.  EKG notes NSR.  No acute changes. Continue work on [employer], regular exercise.  Osteopenia- has not had dexa since 2012. Continue caltrate, obtain follow up dexa and PTH.     Health Maintenance Due  Topic Date Due  . INFLUENZA VACCINE  06/27/2018    There are no preventive care reminders to display for this patient.  Lab Results  Component Value Date   TSH 1.26 10/02/2017   Lab Results  Component Value Date   WBC 4.9 10/02/2017   HGB 13.6 10/02/2017   HCT 40.7 10/02/2017   MCV 94.2 10/02/2017   PLT 209.0 10/02/2017   Lab Results  Component Value Date   NA 140 10/02/2017   K 4.2 10/02/2017   CO2 32 10/02/2017   GLUCOSE 138 (H) 10/02/2017   BUN 14 10/02/2017   CREATININE 0.69 10/02/2017   BILITOT 0.8 10/02/2017   ALKPHOS 49 10/02/2017   AST 13 10/02/2017   ALT 11 10/02/2017   PROT 7.1 10/02/2017   ALBUMIN 4.5 10/02/2017   CALCIUM 9.5 10/02/2017   GFR 99.35 10/02/2017   Lab Results  Component Value Date   CHOL 155 10/02/2017   Lab Results  Component Value Date   HDL 43.00 10/02/2017   Lab Results  Component Value Date   LDLCALC 92 10/02/2017   Lab Results  Component Value Date   TRIG 100.0 10/02/2017   Lab Results  Component Value Date   CHOLHDL 4 10/02/2017   Lab Results  Component Value Date   HGBA1C 5.1 10/03/2017      Assessment & Plan:   Problem List Items Addressed This Visit    None    Visit Diagnoses    Routine general medical examination at a health care facility    -  Primary   Relevant Orders   EKG 12-Lead (Completed)   CBC with Differential/Platelet   Hepatic function panel   Lipid panel   Basic metabolic panel   TSH   Urinalysis, Routine w reflex microscopic   Osteopenia,  unspecified location       Relevant Orders   DG Bone Density   PTH, intact (no Ca)      No orders of the defined types were placed in this encounter.   Follow-up: No follow-ups on file.    Nance Pear, NP

## 2018-10-11 NOTE — Patient Instructions (Signed)
Please complete lab work prior to leaving. Continue to work on healthy diet and regular exercise.

## 2018-10-14 ENCOUNTER — Other Ambulatory Visit: Payer: Self-pay

## 2018-10-14 DIAGNOSIS — E039 Hypothyroidism, unspecified: Secondary | ICD-10-CM

## 2018-10-14 LAB — PARATHYROID HORMONE, INTACT (NO CA): PTH: 27 pg/mL (ref 14–64)

## 2018-10-15 ENCOUNTER — Other Ambulatory Visit (INDEPENDENT_AMBULATORY_CARE_PROVIDER_SITE_OTHER): Payer: 59

## 2018-10-15 DIAGNOSIS — E039 Hypothyroidism, unspecified: Secondary | ICD-10-CM

## 2018-10-16 ENCOUNTER — Telehealth: Payer: Self-pay | Admitting: Family

## 2018-10-16 DIAGNOSIS — E039 Hypothyroidism, unspecified: Secondary | ICD-10-CM

## 2018-10-16 DIAGNOSIS — E038 Other specified hypothyroidism: Secondary | ICD-10-CM

## 2018-10-16 DIAGNOSIS — E0590 Thyrotoxicosis, unspecified without thyrotoxic crisis or storm: Secondary | ICD-10-CM

## 2018-10-16 LAB — T4, FREE: Free T4: 0.88 ng/dL (ref 0.60–1.60)

## 2018-10-16 LAB — T3: T3 TOTAL: 86 ng/dL (ref 76–181)

## 2018-10-16 NOTE — Telephone Encounter (Signed)
See my chart message

## 2018-10-18 DIAGNOSIS — E039 Hypothyroidism, unspecified: Secondary | ICD-10-CM | POA: Insufficient documentation

## 2018-10-18 DIAGNOSIS — E038 Other specified hypothyroidism: Secondary | ICD-10-CM | POA: Insufficient documentation

## 2018-10-18 NOTE — Addendum Note (Signed)
Addended by: Debbrah Alar on: 10/18/2018 01:41 PM   Modules accepted: Orders

## 2018-12-04 ENCOUNTER — Encounter: Payer: Self-pay | Admitting: Family

## 2018-12-04 DIAGNOSIS — E039 Hypothyroidism, unspecified: Secondary | ICD-10-CM

## 2018-12-11 ENCOUNTER — Other Ambulatory Visit (INDEPENDENT_AMBULATORY_CARE_PROVIDER_SITE_OTHER): Payer: 59

## 2018-12-11 DIAGNOSIS — E039 Hypothyroidism, unspecified: Secondary | ICD-10-CM

## 2018-12-11 LAB — T4, FREE: Free T4: 1.02 ng/dL (ref 0.60–1.60)

## 2018-12-11 LAB — T3, FREE: T3, Free: 2.6 pg/mL (ref 2.3–4.2)

## 2018-12-11 LAB — TSH: TSH: 4.61 u[IU]/mL — ABNORMAL HIGH (ref 0.35–4.50)

## 2018-12-12 ENCOUNTER — Telehealth: Payer: Self-pay | Admitting: Family

## 2018-12-12 DIAGNOSIS — E039 Hypothyroidism, unspecified: Secondary | ICD-10-CM

## 2018-12-12 NOTE — Telephone Encounter (Signed)
Results of thyroid testing are much better and almost in the normal range.  I would recommend that she repeat labs again in 6 weeks.

## 2018-12-13 ENCOUNTER — Other Ambulatory Visit: Payer: Self-pay

## 2018-12-13 DIAGNOSIS — E039 Hypothyroidism, unspecified: Secondary | ICD-10-CM

## 2018-12-13 NOTE — Telephone Encounter (Signed)
Results given to patient, she will call back to schedule lab appointment to repeat tsh in about 6 weeks.

## 2018-12-13 NOTE — Progress Notes (Signed)
tsh

## 2019-01-09 MED FILL — HALOBETASOL PROP 0.05% CRM: 0.05 | 10 days supply | Qty: 30 | Fill #0

## 2019-01-21 ENCOUNTER — Encounter: Payer: Self-pay | Admitting: Family

## 2019-01-23 NOTE — Addendum Note (Signed)
Addended by: Kelle Darting A on: 01/23/2019 10:40 AM   Modules accepted: Orders

## 2019-01-23 NOTE — Addendum Note (Signed)
Addended by: Kelle Darting A on: 01/23/2019 10:43 AM   Modules accepted: Orders

## 2019-01-23 NOTE — Addendum Note (Signed)
Addended by: Kelle Darting A on: 01/23/2019 10:42 AM   Modules accepted: Orders

## 2019-04-15 DIAGNOSIS — D2230 Melanocytic nevi of unspecified part of face: Secondary | ICD-10-CM | POA: Diagnosis not present

## 2019-04-15 DIAGNOSIS — D225 Melanocytic nevi of trunk: Secondary | ICD-10-CM | POA: Diagnosis not present

## 2019-04-15 DIAGNOSIS — D224 Melanocytic nevi of scalp and neck: Secondary | ICD-10-CM | POA: Diagnosis not present

## 2019-04-15 DIAGNOSIS — D2272 Melanocytic nevi of left lower limb, including hip: Secondary | ICD-10-CM | POA: Diagnosis not present

## 2019-04-15 DIAGNOSIS — Z86018 Personal history of other benign neoplasm: Secondary | ICD-10-CM | POA: Diagnosis not present

## 2019-04-15 DIAGNOSIS — L639 Alopecia areata, unspecified: Secondary | ICD-10-CM | POA: Diagnosis not present

## 2019-04-15 DIAGNOSIS — L821 Other seborrheic keratosis: Secondary | ICD-10-CM | POA: Diagnosis not present

## 2019-04-28 ENCOUNTER — Other Ambulatory Visit: Payer: Self-pay | Admitting: Family

## 2019-04-28 DIAGNOSIS — Z1231 Encounter for screening mammogram for malignant neoplasm of breast: Secondary | ICD-10-CM

## 2019-05-06 DIAGNOSIS — Z01419 Encounter for gynecological examination (general) (routine) without abnormal findings: Secondary | ICD-10-CM | POA: Diagnosis not present

## 2019-05-06 DIAGNOSIS — Z681 Body mass index (BMI) 19 or less, adult: Secondary | ICD-10-CM | POA: Diagnosis not present

## 2019-05-06 DIAGNOSIS — Z1151 Encounter for screening for human papillomavirus (HPV): Secondary | ICD-10-CM | POA: Diagnosis not present

## 2019-09-08 ENCOUNTER — Encounter: Payer: Self-pay | Admitting: Family

## 2019-09-08 NOTE — Telephone Encounter (Signed)
Information documented in patient's chart.

## 2019-09-12 ENCOUNTER — Other Ambulatory Visit: Payer: Self-pay

## 2019-09-12 ENCOUNTER — Ambulatory Visit (HOSPITAL_BASED_OUTPATIENT_CLINIC_OR_DEPARTMENT_OTHER)
Admission: RE | Admit: 2019-09-12 | Discharge: 2019-09-12 | Disposition: A | Discharge status: Home / Self Care | Payer: 59 | Source: Ambulatory Visit | Attending: Family | Admitting: Family

## 2019-09-12 DIAGNOSIS — M8580 Other specified disorders of bone density and structure, unspecified site: Secondary | ICD-10-CM | POA: Insufficient documentation

## 2019-09-12 DIAGNOSIS — M85851 Other specified disorders of bone density and structure, right thigh: Secondary | ICD-10-CM | POA: Diagnosis not present

## 2019-09-12 DIAGNOSIS — Z1231 Encounter for screening mammogram for malignant neoplasm of breast: Secondary | ICD-10-CM | POA: Insufficient documentation

## 2019-09-12 DIAGNOSIS — Z780 Asymptomatic menopausal state: Secondary | ICD-10-CM | POA: Diagnosis not present

## 2019-09-15 ENCOUNTER — Other Ambulatory Visit (INDEPENDENT_AMBULATORY_CARE_PROVIDER_SITE_OTHER): Payer: 59

## 2019-09-15 DIAGNOSIS — E039 Hypothyroidism, unspecified: Secondary | ICD-10-CM | POA: Diagnosis not present

## 2019-09-15 LAB — T3, FREE: T3, Free: 3.1 pg/mL (ref 2.3–4.2)

## 2019-09-15 LAB — TSH: TSH: 2.27 u[IU]/mL (ref 0.35–4.50)

## 2019-09-15 LAB — T4, FREE: Free T4: 0.9 ng/dL (ref 0.60–1.60)

## 2019-09-17 ENCOUNTER — Encounter: Payer: Self-pay | Admitting: Family

## 2019-09-25 DIAGNOSIS — H52223 Regular astigmatism, bilateral: Secondary | ICD-10-CM | POA: Diagnosis not present

## 2019-09-25 DIAGNOSIS — H35411 Lattice degeneration of retina, right eye: Secondary | ICD-10-CM | POA: Diagnosis not present

## 2019-09-25 DIAGNOSIS — H1789 Other corneal scars and opacities: Secondary | ICD-10-CM | POA: Diagnosis not present

## 2019-10-13 ENCOUNTER — Encounter: Payer: 59 | Admitting: Family

## 2019-10-31 ENCOUNTER — Encounter: Payer: Self-pay | Admitting: Family

## 2019-10-31 ENCOUNTER — Other Ambulatory Visit: Payer: Self-pay

## 2019-10-31 ENCOUNTER — Ambulatory Visit (INDEPENDENT_AMBULATORY_CARE_PROVIDER_SITE_OTHER): Payer: 59 | Admitting: Family

## 2019-10-31 VITALS — BP 112/78 | HR 102 | Temp 97.1°F | Resp 16 | Ht 67.0 in | Wt 124.0 lb

## 2019-10-31 DIAGNOSIS — Z0000 Encounter for general adult medical examination without abnormal findings: Secondary | ICD-10-CM | POA: Diagnosis not present

## 2019-10-31 LAB — CBC WITH DIFFERENTIAL/PLATELET
Basophils Absolute: 0 10*3/uL (ref 0.0–0.1)
Basophils Relative: 0.6 % (ref 0.0–3.0)
Eosinophils Absolute: 0.1 10*3/uL (ref 0.0–0.7)
Eosinophils Relative: 1.6 % (ref 0.0–5.0)
HCT: 41.6 % (ref 36.0–46.0)
Hemoglobin: 14 g/dL (ref 12.0–15.0)
Lymphocytes Relative: 23 % (ref 12.0–46.0)
Lymphs Abs: 1.1 10*3/uL (ref 0.7–4.0)
MCHC: 33.7 g/dL (ref 30.0–36.0)
MCV: 95 fl (ref 78.0–100.0)
Monocytes Absolute: 0.2 10*3/uL (ref 0.1–1.0)
Monocytes Relative: 5 % (ref 3.0–12.0)
Neutro Abs: 3.4 10*3/uL (ref 1.4–7.7)
Neutrophils Relative %: 69.8 % (ref 43.0–77.0)
Platelets: 218 10*3/uL (ref 150.0–400.0)
RBC: 4.38 Mil/uL (ref 3.87–5.11)
RDW: 13 % (ref 11.5–15.5)
WBC: 4.9 10*3/uL (ref 4.0–10.5)

## 2019-10-31 LAB — HEPATIC FUNCTION PANEL
ALT: 11 U/L (ref 0–35)
AST: 16 U/L (ref 0–37)
Albumin: 4.5 g/dL (ref 3.5–5.2)
Alkaline Phosphatase: 54 U/L (ref 39–117)
Bilirubin, Direct: 0.2 mg/dL (ref 0.0–0.3)
Total Bilirubin: 1.1 mg/dL (ref 0.2–1.2)
Total Protein: 6.7 g/dL (ref 6.0–8.3)

## 2019-10-31 LAB — TSH: TSH: 4.91 u[IU]/mL — ABNORMAL HIGH (ref 0.35–4.50)

## 2019-10-31 LAB — BASIC METABOLIC PANEL
BUN: 23 mg/dL (ref 6–23)
CO2: 29 mEq/L (ref 19–32)
Calcium: 9.6 mg/dL (ref 8.4–10.5)
Chloride: 104 mEq/L (ref 96–112)
Creatinine, Ser: 0.77 mg/dL (ref 0.40–1.20)
GFR: 81.55 mL/min (ref >60.00)
Glucose, Bld: 93 mg/dL (ref 70–99)
Potassium: 5.2 mEq/L — ABNORMAL HIGH (ref 3.5–5.1)
Sodium: 140 mEq/L (ref 135–145)

## 2019-10-31 LAB — LIPID PANEL
Cholesterol: 174 mg/dL (ref 0–200)
HDL: 48.8 mg/dL (ref >39.00)
LDL Cholesterol: 116 mg/dL — ABNORMAL HIGH (ref 0–99)
NonHDL: 124.94
Total CHOL/HDL Ratio: 4
Triglycerides: 46 mg/dL (ref 0.0–149.0)
VLDL: 9.2 mg/dL (ref 0.0–40.0)

## 2019-10-31 NOTE — Progress Notes (Signed)
Subjective:    Patient ID: [name], female    DOB: 1976/11/04, 43 y.o.   MRN: [id number]  HPI  Patient is a 43 yr old female who presents today for cpx.  Patient presents today for complete physical.  Immunizations: flu shot up to date, Tdap up to date Diet: healthy Wt Readings from Last 3 Encounters:  10/31/19 124 lb (56.2 kg)  10/11/18 124 lb 9.6 oz (56.5 kg)  04/10/18 118 lb 9.6 oz (53.8 kg)  Exercise: some exercise, walking Pap Smear: 03/27/17 Mammogram: 09/12/19 Vision: up to date Dental: due  Review of Systems  Constitutional: Negative for unexpected weight change.  HENT: Negative for hearing loss and rhinorrhea.   Eyes: Negative for visual disturbance.  Respiratory: Negative for cough and shortness of breath.   Cardiovascular: Negative for chest pain.  Gastrointestinal: Negative for constipation (occasional- uses colace).  Genitourinary: Negative for dysuria and frequency.  Musculoskeletal: Negative for arthralgias and myalgias.  Skin: Negative for rash.  Neurological: Negative for headaches.  Hematological: Negative for adenopathy.  Psychiatric/Behavioral:       Notes some increased work stress   Past Medical History:  Diagnosis Date  . Hypercalcemia   . Osteopenia   . Postpartum care following vaginal delivery (5/19) 04/15/2015  . Postpartum care following vaginal delivery (7/26) 06/22/2012     Social History   Socioeconomic History  . Marital status: Married    Spouse name: Not on file  . Number of children: Not on file  . Years of education: Not on file  . Highest education level: Not on file  Occupational History  . Not on file  Social Needs  . Financial resource strain: Not on file  . Food insecurity    Worry: Not on file    Inability: Not on file  . Transportation needs    Medical: Not on file    Non-medical: Not on file  Tobacco Use  . Smoking status: Never Smoker  . Smokeless tobacco: Never Used  Substance and Sexual Activity  .  Alcohol use: No  . Drug use: No  . Sexual activity: Yes    Birth control/protection: None  Lifestyle  . Physical activity    Days per week: Not on file    Minutes per session: Not on file  . Stress: Not on file  Relationships  . Social Herbalist on phone: Not on file    Gets together: Not on file    Attends religious service: Not on file    Active member of club or organization: Not on file    Attends meetings of clubs or organizations: Not on file    Relationship status: Not on file  . Intimate partner violence    Fear of current or ex partner: Not on file    Emotionally abused: Not on file    Physically abused: Not on file    Forced sexual activity: Not on file  Other Topics Concern  . Not on file  Social History Narrative   Exercise-none   Fruits and veggies, 3 meals a day. Minimal snacking.   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 LASIK    . NSVD   07/23/2009  . WISDOM TOOTH EXTRACTION      Family History  Problem Relation Age of Onset  . Heart attack Father   . Basal cell carcinoma Mother   . Hyperlipidemia Mother   . Diabetes Maternal Grandmother   . Kidney disease Maternal Grandmother   . Cancer Maternal Grandfather        lung  . Osteoporosis Paternal Grandmother   . Diabetes Maternal Uncle   . Kidney disease Maternal Uncle   . CVA Brother        occured in his 71's after trauma to his neck.    . CAD Paternal Uncle        died in 52's for MI  . Kidney disease Son        one cystic kidney    Allergies  Allergen Reactions  . Azithromycin Nausea And Vomiting and Other (See Comments)    DIZZINESS  . Sulfa Antibiotics Rash    Current Outpatient Medications on File Prior to Visit  Medication Sig Dispense Refill  . cholecalciferol (VITAMIN D) 1000 units tablet Take 1,000  Units by mouth daily.    Marland Kitchen docusate sodium (COLACE) 100 MG capsule Take 100 mg by mouth daily as needed for mild constipation.    . famotidine (PEPCID) 20 MG tablet Take 20 mg by mouth daily as needed.     . fluticasone (FLONASE) 50 MCG/ACT nasal spray Place 1 spray daily into both nostrils.    Marland Kitchen ibuprofen (ADVIL,MOTRIN) 600 MG tablet Take 1 tablet (600 mg total) by mouth every 6 (six) hours. 30 tablet 0  . loratadine (CLARITIN) 10 MG tablet Take 10 mg by mouth daily as needed for allergies.     No current facility-administered medications on file prior to visit.     BP 112/78 (BP Location: Right Arm, Patient Position: Sitting, Cuff Size: Normal)   Pulse (!) 102   Temp (!) 97.1 F (36.2 C) (Temporal)   Resp 16   Ht 5\' 7"  (1.702 m)   Wt 124 lb (56.2 kg)   LMP 10/19/2019 (Exact Date)   SpO2 100%   BMI 19.42 kg/m       Objective:   Physical Exam  Physical Exam  Constitutional: She is oriented to person, place, and time. She appears well-developed and well-nourished. No distress.  HENT:  Head: Normocephalic and atraumatic.  Right Ear: Tympanic membrane and ear canal normal.  Left Ear: Tympanic membrane and ear canal normal.  Mouth/Throat:, Patient wearing mask. Eyes: Pupils are equal, round, and reactive to light. No scleral icterus.  Neck: Normal range of motion. No thyromegaly present.  Cardiovascular: Normal rate and regular rhythm.   No murmur heard. Pulmonary/Chest: Effort normal and breath sounds normal. No respiratory distress. He has no wheezes. She has no rales. She exhibits no tenderness.  Abdominal: Soft. Bowel sounds are normal. She exhibits no distension and no mass. There is no tenderness. There is no rebound and no guarding.  Musculoskeletal: She exhibits no edema.  Lymphadenopathy:    She has no cervical adenopathy.  Neurological: She is alert and oriented to person, place, and time. She has normal patellar reflexes. She exhibits normal muscle tone. Coordination  normal.  Skin: Skin is warm and dry.  Psychiatric: She has a normal mood and affect. Her behavior is normal. Judgment and thought content normal.  Breast/pelvic-deferred          Assessment & Plan:  Preventive care-encourage patient to continue healthy diet immunizations reviewed and up-to-date.   This visit occurred during the SARS-CoV-2 public health emergency.  Safety protocols were  in place, including screening questions prior to the visit, additional usage of staff PPE, and extensive cleaning of exam room while observing appropriate contact time as indicated for disinfecting solutions.       Assessment & Plan:

## 2019-10-31 NOTE — Patient Instructions (Signed)
Please complete lab work prior to leaving.    Preventive Care 40-43 Years Old, Female Preventive care refers to visits with your health care provider and lifestyle choices that can promote health and wellness. This includes:  A yearly physical exam. This may also be called an annual well check.  Regular dental visits and eye exams.  Immunizations.  Screening for certain conditions.  Healthy lifestyle choices, such as eating a healthy diet, getting regular exercise, not using drugs or products that contain nicotine and tobacco, and limiting alcohol use. What can I expect for my preventive care visit? Physical exam Your health care provider will check your:  Height and weight. This may be used to calculate body mass index (BMI), which tells if you are at a healthy weight.  Heart rate and blood pressure.  Skin for abnormal spots. Counseling Your health care provider may ask you questions about your:  Alcohol, tobacco, and drug use.  Emotional well-being.  Home and relationship well-being.  Sexual activity.  Eating habits.  Work and work environment.  Method of birth control.  Menstrual cycle.  Pregnancy history. What immunizations do I need?  Influenza (flu) vaccine  This is recommended every year. Tetanus, diphtheria, and pertussis (Tdap) vaccine  You may need a Td booster every 10 years. Varicella (chickenpox) vaccine  You may need this if you have not been vaccinated. Zoster (shingles) vaccine  You may need this after age 60. Measles, mumps, and rubella (MMR) vaccine  You may need at least one dose of MMR if you were born in 1957 or later. You may also need a second dose. Pneumococcal conjugate (PCV13) vaccine  You may need this if you have certain conditions and were not previously vaccinated. Pneumococcal polysaccharide (PPSV23) vaccine  You may need one or two doses if you smoke cigarettes or if you have certain conditions. Meningococcal conjugate  (MenACWY) vaccine  You may need this if you have certain conditions. Hepatitis A vaccine  You may need this if you have certain conditions or if you travel or work in places where you may be exposed to hepatitis A. Hepatitis B vaccine  You may need this if you have certain conditions or if you travel or work in places where you may be exposed to hepatitis B. Haemophilus influenzae type b (Hib) vaccine  You may need this if you have certain conditions. Human papillomavirus (HPV) vaccine  If recommended by your health care provider, you may need three doses over 6 months. You may receive vaccines as individual doses or as more than one vaccine together in one shot (combination vaccines). Talk with your health care provider about the risks and benefits of combination vaccines. What tests do I need? Blood tests  Lipid and cholesterol levels. These may be checked every 5 years, or more frequently if you are over 50 years old.  Hepatitis C test.  Hepatitis B test. Screening  Lung cancer screening. You may have this screening every year starting at age 55 if you have a 30-pack-year history of smoking and currently smoke or have quit within the past 15 years.  Colorectal cancer screening. All adults should have this screening starting at age 50 and continuing until age 75. Your health care provider may recommend screening at age 45 if you are at increased risk. You will have tests every 1-10 years, depending on your results and the type of screening test.  Diabetes screening. This is done by checking your blood sugar (glucose) after you have   not eaten for a while (fasting). You may have this done every 1-3 years.  Mammogram. This may be done every 1-2 years. Talk with your health care provider about when you should start having regular mammograms. This may depend on whether you have a family history of breast cancer.  BRCA-related cancer screening. This may be done if you have a family  history of breast, ovarian, tubal, or peritoneal cancers.  Pelvic exam and Pap test. This may be done every 3 years starting at age 21. Starting at age 30, this may be done every 5 years if you have a Pap test in combination with an HPV test. Other tests  Sexually transmitted disease (STD) testing.  Bone density scan. This is done to screen for osteoporosis. You may have this scan if you are at high risk for osteoporosis. Follow these instructions at home: Eating and drinking  Eat a diet that includes fresh fruits and vegetables, whole grains, lean protein, and low-fat dairy.  Take vitamin and mineral supplements as recommended by your health care provider.  Do not drink alcohol if: ? Your health care provider tells you not to drink. ? You are pregnant, may be pregnant, or are planning to become pregnant.  If you drink alcohol: ? Limit how much you have to 0-1 drink a day. ? Be aware of how much alcohol is in your drink. In the [place] one drink equals one 12 oz bottle of beer (355 mL), one 5 oz glass of wine (148 mL), or one 1 oz glass of hard liquor (44 mL). Lifestyle  Take daily care of your teeth and gums.  Stay active. Exercise for at least 30 minutes on 5 or more days each week.  Do not use any products that contain nicotine or tobacco, such as cigarettes, e-cigarettes, and chewing tobacco. If you need help quitting, ask your health care provider.  If you are sexually active, practice safe sex. Use a condom or other form of birth control (contraception) in order to prevent pregnancy and STIs (sexually transmitted infections).  If told by your health care provider, take low-dose aspirin daily starting at age 50. What's next?  Visit your health care provider once a year for a well check visit.  Ask your health care provider how often you should have your eyes and teeth checked.  Stay up to date on all vaccines. This information is not intended to replace advice given to you  by your health care provider. Make sure you discuss any questions you have with your health care provider. Document Released: 12/10/2015 Document Revised: 07/25/2018 Document Reviewed: 07/25/2018 Elsevier Patient Education  2020 Elsevier Inc.  

## 2019-11-04 ENCOUNTER — Other Ambulatory Visit: Payer: Self-pay

## 2019-11-04 ENCOUNTER — Telehealth: Payer: Self-pay | Admitting: Family

## 2019-11-04 DIAGNOSIS — E875 Hyperkalemia: Secondary | ICD-10-CM

## 2019-11-04 NOTE — Telephone Encounter (Signed)
Patient advised of potassium results, she is not taking any potassium or supplements with potassium. Bmp was ordered she will have this in one month at the lab on [address] due to convenience.

## 2019-11-04 NOTE — Telephone Encounter (Signed)
Left message at work for patient to call me back.

## 2019-11-04 NOTE — Telephone Encounter (Signed)
Pt called back, office was on the other line. Please contact her at  [place] number [phone]

## 2019-11-04 NOTE — Telephone Encounter (Signed)
Advise patient that her she taking any?  If so please get discontinue.  I would like for her to repeat a basic metabolic panel in 1 month.  Diagnosis is hyperkalemia.

## 2019-11-18 ENCOUNTER — Encounter: Payer: Self-pay | Admitting: Family

## 2019-11-19 MED ORDER — FLUTICASONE PROPIONATE 50 MCG/ACT NA SUSP: 1.0000 | Freq: Every day | NASAL | 5 refills | Status: DC

## 2019-11-19 MED FILL — FLUTICASONE PROP 50 MCG SPR: 50 | 60 days supply | Qty: 16 | Fill #0

## 2019-12-31 ENCOUNTER — Telehealth: Payer: Self-pay | Admitting: Family

## 2019-12-31 ENCOUNTER — Other Ambulatory Visit: Payer: Self-pay

## 2019-12-31 NOTE — Telephone Encounter (Signed)
Please contact pt to schedule follow up lab appointment to recheck her potassium. bmet is already ordered.

## 2019-12-31 NOTE — Telephone Encounter (Signed)
Lvm on cell for patient to call back for lab appointment to check on bmet

## 2020-01-01 ENCOUNTER — Other Ambulatory Visit (INDEPENDENT_AMBULATORY_CARE_PROVIDER_SITE_OTHER): Payer: 59

## 2020-01-01 ENCOUNTER — Other Ambulatory Visit: Payer: Self-pay

## 2020-01-01 DIAGNOSIS — E875 Hyperkalemia: Secondary | ICD-10-CM | POA: Diagnosis not present

## 2020-01-01 LAB — BASIC METABOLIC PANEL
BUN: 23 mg/dL (ref 6–23)
CO2: 26 mEq/L (ref 19–32)
Calcium: 9 mg/dL (ref 8.4–10.5)
Chloride: 106 mEq/L (ref 96–112)
Creatinine, Ser: 0.86 mg/dL (ref 0.40–1.20)
GFR: 71.72 mL/min (ref >60.00)
Glucose, Bld: 86 mg/dL (ref 70–99)
Potassium: 4.1 mEq/L (ref 3.5–5.1)
Sodium: 139 mEq/L (ref 135–145)

## 2020-01-02 NOTE — Telephone Encounter (Signed)
Patient reports she received message and came yesterday for labs.

## 2020-01-13 MED FILL — FLUTICASONE PROP 50 MCG SPR: 50 | 60 days supply | Qty: 16 | Fill #1

## 2020-02-12 ENCOUNTER — Ambulatory Visit (INDEPENDENT_AMBULATORY_CARE_PROVIDER_SITE_OTHER): Payer: 59 | Admitting: Internal Medicine

## 2020-02-12 ENCOUNTER — Encounter: Payer: Self-pay | Admitting: Internal Medicine

## 2020-02-12 VITALS — Ht 67.0 in | Wt 132.0 lb

## 2020-02-12 DIAGNOSIS — J020 Streptococcal pharyngitis: Secondary | ICD-10-CM | POA: Diagnosis not present

## 2020-02-12 MED ORDER — AMOXICILLIN 875 MG PO TABS: 875.0000 mg | ORAL_TABLET | Freq: Two times a day (BID) | ORAL | 0 refills | Status: DC

## 2020-02-12 NOTE — Progress Notes (Signed)
Pre visit review using our clinic review tool, if applicable. No additional management support is needed unless otherwise documented below in the visit note. 

## 2020-02-12 NOTE — Progress Notes (Signed)
Subjective:    Patient ID: [name], female    DOB: Aug 21, 1976, 44 y.o.   MRN: [id number]  DOS:  02/12/2020 Type of visit - description: Virtual Visit via Video Note  I connected with the above patient  by a video enabled telemedicine application and verified that I am speaking with the correct person using two identifiers.   THIS ENCOUNTER IS A VIRTUAL VISIT DUE TO COVID-19 - PATIENT WAS NOT SEEN IN THE OFFICE. PATIENT HAS CONSENTED TO VIRTUAL VISIT / TELEMEDICINE VISIT   Location of patient: home  Location of provider: office  I discussed the limitations of evaluation and management by telemedicine and the availability of in person appointments. The patient expressed understanding and agreed to proceed.  Acute Symptoms started 2 days ago with sore throat which is persistent. When she looks at the mirror, the tonsils are not not enlarged but they are definitely red without any white patches. She is a slightly hoarse. Other than that she feels well.  Her son went to the doctor 3 days ago, he tested positive for Covid but had a positive a strep test, started on antibiotics. Her other child is also sick and is being treated empirically with antibiotics for possible strep infection.    Review of Systems Has checked her temperature and she has no fever. Mild runny nose No nausea, vomiting. No myalgias  Past Medical History:  Diagnosis Date  . Hypercalcemia   . Osteopenia   . Postpartum care following vaginal delivery (5/19) 04/15/2015  . Postpartum care following vaginal delivery (7/26) 06/22/2012    Past Surgical History:  Procedure Laterality Date  . LASIK    . NSVD   07/23/2009  . WISDOM TOOTH EXTRACTION      Allergies as of 02/12/2020      Reactions   Azithromycin Nausea And Vomiting, Other (See Comments)   DIZZINESS   Sulfa Antibiotics Rash      Medication List       Accurate as of February 12, 2020 11:03 AM. If you have any questions, ask your nurse or  doctor.        cholecalciferol 1000 units tablet Commonly known as: VITAMIN D Take 1,000 Units by mouth daily.   docusate sodium 100 MG capsule Commonly known as: COLACE Take 100 mg by mouth daily as needed for mild constipation.   famotidine 20 MG tablet Commonly known as: PEPCID Take 20 mg by mouth daily as needed.   fluticasone 50 MCG/ACT nasal spray Commonly known as: FLONASE Place 1 spray into both nostrils daily.   ibuprofen 600 MG tablet Commonly known as: ADVIL Take 1 tablet (600 mg total) by mouth every 6 (six) hours.   loratadine 10 MG tablet Commonly known as: CLARITIN Take 10 mg by mouth daily as needed for allergies.          Objective:   Physical Exam Ht 5\' 7"  (1.702 m)   Wt 132 lb (59.9 kg)   LMP 01/26/2020   Breastfeeding No   BMI 20.67 kg/m  This is a virtual video visit, she is alert oriented x3, in no apparent distress.  Perhaps her voice is slightly hoarse but no stridor    Assessment    44 year old female, presents with  Pharyngitis: The patient know the limitations of a virtual visit, under normal circumstances I would do a physical exam and check strep test however based on her symptoms and recent exposure to a patient with documented strep pharyngitis, I am  willing to start medication. Rx Amoxil x10 stay, Tylenol, gargles with warm salt water. Call if no better. She already had a Covid test, result pending, advise her to keep me posted. She verbalized understanding.    I discussed the assessment and treatment plan with the patient. The patient was provided an opportunity to ask questions and all were answered. The patient agreed with the plan and demonstrated an understanding of the instructions.   The patient was advised to call back or seek an in-person evaluation if the symptoms worsen or if the condition fails to improve as anticipated.

## 2020-02-13 ENCOUNTER — Encounter: Payer: Self-pay | Admitting: Internal Medicine

## 2020-04-15 DIAGNOSIS — L639 Alopecia areata, unspecified: Secondary | ICD-10-CM | POA: Diagnosis not present

## 2020-04-15 DIAGNOSIS — D225 Melanocytic nevi of trunk: Secondary | ICD-10-CM | POA: Diagnosis not present

## 2020-04-15 DIAGNOSIS — D224 Melanocytic nevi of scalp and neck: Secondary | ICD-10-CM | POA: Diagnosis not present

## 2020-04-15 DIAGNOSIS — D2272 Melanocytic nevi of left lower limb, including hip: Secondary | ICD-10-CM | POA: Diagnosis not present

## 2020-04-15 DIAGNOSIS — L918 Other hypertrophic disorders of the skin: Secondary | ICD-10-CM | POA: Diagnosis not present

## 2020-04-15 DIAGNOSIS — D2230 Melanocytic nevi of unspecified part of face: Secondary | ICD-10-CM | POA: Diagnosis not present

## 2020-04-15 DIAGNOSIS — L821 Other seborrheic keratosis: Secondary | ICD-10-CM | POA: Diagnosis not present

## 2020-04-15 DIAGNOSIS — L219 Seborrheic dermatitis, unspecified: Secondary | ICD-10-CM | POA: Diagnosis not present

## 2020-04-15 DIAGNOSIS — L578 Other skin changes due to chronic exposure to nonionizing radiation: Secondary | ICD-10-CM | POA: Diagnosis not present

## 2020-04-20 ENCOUNTER — Encounter: Payer: Self-pay | Admitting: Family

## 2020-04-20 DIAGNOSIS — N6310 Unspecified lump in the right breast, unspecified quadrant: Secondary | ICD-10-CM

## 2020-04-29 MED FILL — FLUTICASONE PROP 50 MCG SPR: 50 | 60 days supply | Qty: 16 | Fill #2

## 2020-05-06 ENCOUNTER — Ambulatory Visit
Admission: RE | Admit: 2020-05-06 | Discharge: 2020-05-06 | Disposition: A | Discharge status: Home / Self Care | Payer: 59 | Source: Ambulatory Visit | Attending: Family | Admitting: Family

## 2020-05-06 ENCOUNTER — Other Ambulatory Visit: Payer: Self-pay

## 2020-05-06 DIAGNOSIS — N6310 Unspecified lump in the right breast, unspecified quadrant: Secondary | ICD-10-CM

## 2020-05-06 DIAGNOSIS — N6011 Diffuse cystic mastopathy of right breast: Secondary | ICD-10-CM | POA: Diagnosis not present

## 2020-05-06 DIAGNOSIS — R922 Inconclusive mammogram: Secondary | ICD-10-CM | POA: Diagnosis not present

## 2020-10-13 ENCOUNTER — Other Ambulatory Visit (HOSPITAL_COMMUNITY): Payer: Self-pay | Admitting: Obstetrics and Gynecology

## 2020-10-13 DIAGNOSIS — Z01419 Encounter for gynecological examination (general) (routine) without abnormal findings: Secondary | ICD-10-CM | POA: Diagnosis not present

## 2020-10-13 DIAGNOSIS — Z124 Encounter for screening for malignant neoplasm of cervix: Secondary | ICD-10-CM | POA: Diagnosis not present

## 2020-10-13 DIAGNOSIS — N943 Premenstrual tension syndrome: Secondary | ICD-10-CM | POA: Diagnosis not present

## 2020-10-13 MED FILL — SERTRALINE HCL 50 MG TABLET: 50 | 30 days supply | Qty: 30 | Fill #0

## 2020-10-25 ENCOUNTER — Other Ambulatory Visit: Payer: Self-pay | Admitting: Family

## 2020-10-25 DIAGNOSIS — Z1231 Encounter for screening mammogram for malignant neoplasm of breast: Secondary | ICD-10-CM

## 2020-10-29 ENCOUNTER — Encounter: Payer: 59 | Admitting: Family

## 2020-11-03 ENCOUNTER — Ambulatory Visit
Admission: RE | Admit: 2020-11-03 | Discharge: 2020-11-03 | Disposition: A | Discharge status: Home / Self Care | Payer: 59 | Source: Ambulatory Visit

## 2020-11-03 ENCOUNTER — Other Ambulatory Visit: Payer: Self-pay

## 2020-11-03 DIAGNOSIS — Z1231 Encounter for screening mammogram for malignant neoplasm of breast: Secondary | ICD-10-CM | POA: Diagnosis not present

## 2020-11-12 ENCOUNTER — Inpatient Hospital Stay: Payer: 59 | Attending: Medical

## 2020-11-12 ENCOUNTER — Other Ambulatory Visit: Payer: Self-pay

## 2020-11-12 DIAGNOSIS — Z23 Encounter for immunization: Secondary | ICD-10-CM | POA: Insufficient documentation

## 2020-11-12 NOTE — Progress Notes (Signed)
   [facility]ov[facility] Vaccination [facility]  Name:  [name]    MRN: [id number] DOB: 1976/09/29  11/12/2020  [name] was observed post Covid-19 immunization for 15 minutes without incident. She was provided with Vaccine Information Sheet and instruction to access the V-Safe system.   [name] was instructed to call 911 with any severe reactions post vaccine: Marland Kitchen Difficulty breathing  . Swelling of face and throat  . A fast heartbeat  . A bad rash all over body  . Dizziness and weakness   Immunizations Administered    Name Date Dose VIS Date Route   Pfizer COVID-19 Vaccine 11/12/2020  3:40 PM 0.3 mL 09/15/2020 Intramuscular   Manufacturer: [place]   Lot: [id number]   [place]: [id number]

## 2020-11-22 ENCOUNTER — Encounter: Payer: Self-pay | Admitting: Family

## 2020-11-24 DIAGNOSIS — Z03818 Encounter for observation for suspected exposure to other biological agents ruled out: Secondary | ICD-10-CM | POA: Diagnosis not present

## 2021-01-28 ENCOUNTER — Other Ambulatory Visit (HOSPITAL_COMMUNITY): Payer: Self-pay | Admitting: Pharmacist

## 2021-01-28 MED FILL — CARESTART COVID-19 HOME TES: 4 days supply | Qty: 4 | Fill #0

## 2021-03-01 ENCOUNTER — Telehealth: Payer: Self-pay | Admitting: Family

## 2021-03-01 ENCOUNTER — Other Ambulatory Visit: Payer: Self-pay

## 2021-03-01 ENCOUNTER — Ambulatory Visit (INDEPENDENT_AMBULATORY_CARE_PROVIDER_SITE_OTHER): Payer: 59 | Admitting: Family

## 2021-03-01 ENCOUNTER — Encounter: Payer: Self-pay | Admitting: Family

## 2021-03-01 VITALS — BP 130/60 | HR 94 | Temp 98.2°F | Resp 16 | Ht 67.0 in | Wt 124.8 lb

## 2021-03-01 DIAGNOSIS — E038 Other specified hypothyroidism: Secondary | ICD-10-CM | POA: Diagnosis not present

## 2021-03-01 DIAGNOSIS — Z0000 Encounter for general adult medical examination without abnormal findings: Secondary | ICD-10-CM | POA: Diagnosis not present

## 2021-03-01 DIAGNOSIS — Z8639 Personal history of other endocrine, nutritional and metabolic disease: Secondary | ICD-10-CM | POA: Diagnosis not present

## 2021-03-01 LAB — BASIC METABOLIC PANEL
BUN: 16 mg/dL (ref 6–23)
CO2: 29 mEq/L (ref 19–32)
Calcium: 9.7 mg/dL (ref 8.4–10.5)
Chloride: 105 mEq/L (ref 96–112)
Creatinine, Ser: 0.71 mg/dL (ref 0.40–1.20)
GFR: 102.96 mL/min (ref >60.00)
Glucose, Bld: 86 mg/dL (ref 70–99)
Potassium: 4.8 mEq/L (ref 3.5–5.1)
Sodium: 142 mEq/L (ref 135–145)

## 2021-03-01 LAB — T3, FREE: T3, Free: 2.7 pg/mL (ref 2.3–4.2)

## 2021-03-01 LAB — TSH: TSH: 5.23 u[IU]/mL — ABNORMAL HIGH (ref 0.35–4.50)

## 2021-03-01 LAB — T4, FREE: Free T4: 1.04 ng/dL (ref 0.60–1.60)

## 2021-03-01 NOTE — Telephone Encounter (Signed)
Records release faxed to [facility]-Dr. cousing's office

## 2021-03-01 NOTE — Progress Notes (Signed)
Subjective:    Patient ID: [name], female    DOB: 1976-03-09, 45 y.o.   MRN: [id number]  HPI  Patient presents today for complete physical.  Immunizations:  tdap 2016, flu shot , pfizer x 3 Diet: healthy, has been cutting out sugars.  Exercise:  Needs improvement  Wt Readings from Last 3 Encounters:  03/01/21 124 lb 12.8 oz (56.6 kg)  02/12/20 132 lb (59.9 kg)  10/31/19 124 lb (56.2 kg)  Colonoscopy: due Dexa: 2020- normal for age Pap Smear: 10/14/2020 Mammogram: 11/03/20 Vision: due Dental: up to date  Lab Results  Component Value Date   TSH 4.91 (H) 10/31/2019   Notes irritability with PMS, sometimes after her period she can cry uncontrollably. (only on some months).  Notes irritability 4-5 days before her period.    Review of Systems  Constitutional: Negative for unexpected weight change.  HENT: Negative for hearing loss and rhinorrhea.   Eyes: Positive for discharge (right eye waters which she attributes to dry eye, uses moiturizing gel). Negative for visual disturbance.  Respiratory: Negative for cough and shortness of breath.   Cardiovascular: Negative for chest pain.  Gastrointestinal: Negative for blood in stool (only if she strains hard with BM- very small amount), constipation and diarrhea.  Genitourinary: Negative for dysuria, frequency and hematuria.  Musculoskeletal: Negative for arthralgias and myalgias.  Neurological: Positive for headaches.  Hematological: Negative for adenopathy.  Psychiatric/Behavioral:       Only with menstrual cycle- irritability- see above.   Past Medical History:  Diagnosis Date  . Hypercalcemia   . Osteopenia   . Postpartum care following vaginal delivery (5/19) 04/15/2015  . Postpartum care following vaginal delivery (7/26) 06/22/2012     Social History   Socioeconomic History  . Marital status: Married    Spouse name: Not on file  . Number of children: Not on file  . Years of education: Not on file  . Highest  education level: Not on file  Occupational History  . Not on file  Tobacco Use  . Smoking status: Never Smoker  . Smokeless tobacco: Never Used  Vaping Use  . Vaping Use: Never used  Substance and Sexual Activity  . Alcohol use: No  . Drug use: No  . Sexual activity: Yes    Birth control/protection: None  Other Topics Concern  . Not on file  Social History Narrative   Exercise-none   Fruits and veggies, 3 meals a day. Minimal snacking.   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 LASIK    . NSVD   07/23/2009  . WISDOM TOOTH EXTRACTION      Family History  Problem Relation Age of Onset  . Heart attack Father   . Basal cell carcinoma Mother   . Hyperlipidemia Mother   . Diabetes Maternal Grandmother   . Kidney disease Maternal Grandmother   . Cancer Maternal Grandfather        lung  . Osteoporosis Paternal Grandmother   . Diabetes  Maternal Uncle   . Kidney disease Maternal Uncle   . CVA Brother        occured in his 27's after trauma to his neck.    . CAD Paternal Uncle        died in 51's for MI  . Kidney disease Son        one cystic kidney    Allergies  Allergen Reactions  . Azithromycin Nausea And Vomiting and Other (See Comments)    DIZZINESS  . Sulfa Antibiotics Rash    Current Outpatient Medications on File Prior to Visit  Medication Sig Dispense Refill  . cholecalciferol (VITAMIN D) 1000 units tablet Take 1,000 Units by mouth daily.    [facility] COVID-19 At Home Antigen Test KIT USE AS DIRECTED WITHIN PACKAGE INSTRUCTIONS (Patient  taking differently: USE AS DIRECTED WITHIN PACKAGE INSTRUCTIONS) 4 kit 0  . docusate sodium (COLACE) 100 MG capsule Take 100 mg by mouth daily as needed for mild constipation.    . famotidine (PEPCID) 20 MG tablet Take 20 mg by mouth daily as needed.     . fluticasone (FLONASE) 50 MCG/ACT nasal spray Place 1 spray into both nostrils daily. 16 g 5  . ibuprofen (ADVIL,MOTRIN) 600 MG tablet Take 1 tablet (600 mg total) by mouth every 6 (six) hours. 30 tablet 0  . loratadine (CLARITIN) 10 MG tablet Take 10 mg by mouth daily as needed for allergies.     No current facility-administered medications on file prior to visit.    BP 130/60 (BP Location: Right Arm, Patient Position: Sitting, Cuff Size: Small)   Pulse 94   Temp 98.2 F (36.8 C) (Oral)   Resp 16   Ht 5' 7"  (1.702 m)   Wt 124 lb 12.8 oz (56.6 kg)   SpO2 100%   BMI 19.55 kg/m       Objective:   Physical Exam  Physical Exam  Constitutional: She is oriented to person, place, and time. She appears well-developed and well-nourished. No distress.  HENT:  Head: Normocephalic and atraumatic.  Right Ear: Tympanic membrane and ear canal normal.  Left Ear: Tympanic membrane and ear canal normal.  Mouth/Throat: Not examined- pt wearing mask Eyes: Pupils are equal, round, and reactive to light. No scleral icterus.  Neck: Normal range of motion. No thyromegaly present.  Cardiovascular: Normal rate and regular rhythm.   No murmur heard. Pulmonary/Chest: Effort normal and breath sounds normal. No respiratory distress. He has no wheezes. She has no rales. She exhibits no tenderness.  Abdominal: Soft. Bowel sounds are normal. She exhibits no distension and no mass. There is no tenderness. There is no rebound and no guarding.  Musculoskeletal: She exhibits no edema.  Lymphadenopathy:    She has no cervical adenopathy.  Neurological: She is alert and oriented to person, place, and time. She has normal patellar reflexes. She exhibits normal  muscle tone. Coordination normal.  Skin: Skin is warm and dry.  Psychiatric: She has a normal mood and affect. Her behavior is normal. Judgment and thought content normal.  Breast/pelvic: deferred        Assessment & Plan:  Preventative care- pap/mammo up to date. Due for colo based on new guidelines. Encouraged pt to continue healthy diet, exercise.  Immunizations reviewed an up to date. Recommended that she schedule a routine eye exam.    Hx of subclinical hypothyroid- obtain follow up TFT's. Clinically stable.  Hx of Hyperkalemia- obtain follow up BMET.  She is considering starting zoloft rx'd  by her GYN for her mood issues surrounding her period.  This visit occurred during the SARS-CoV-2 public health emergency.  Safety protocols were in place, including screening questions prior to the visit, additional usage of staff PPE, and extensive cleaning of exam room while observing appropriate contact time as indicated for disinfecting solutions.            Assessment & Plan:

## 2021-03-01 NOTE — Telephone Encounter (Signed)
Please call Dr. Harvie Bridge office and request copy of pap from 11/21

## 2021-03-03 ENCOUNTER — Telehealth: Payer: Self-pay | Admitting: Family

## 2021-03-03 DIAGNOSIS — E038 Other specified hypothyroidism: Secondary | ICD-10-CM

## 2021-03-03 NOTE — Telephone Encounter (Signed)
Lvm for Patient to call back about her results

## 2021-03-03 NOTE — Telephone Encounter (Signed)
Patient advised of results, she reports having none of the symptoms listed.  She was scheduled to come in tomorrow for the labs.

## 2021-03-03 NOTE — Telephone Encounter (Signed)
Please call pt and let her know that her thyroid testing is showing mild hypothyroidism.  I would like to have her return to the lab to do some additional testing to check for antibodies that may suggest her thyroid function may continue to decrease over time.  If she has any symptoms of hypothyroid or if her antibody levels are elevated, I would recommend that she begin synthroid.  Does she have any of the following symptoms:  Fatigue Dry Skin Constipation Cold intolerance Joint pain?

## 2021-03-04 ENCOUNTER — Other Ambulatory Visit (INDEPENDENT_AMBULATORY_CARE_PROVIDER_SITE_OTHER): Payer: 59

## 2021-03-04 ENCOUNTER — Other Ambulatory Visit: Payer: Self-pay

## 2021-03-04 DIAGNOSIS — E038 Other specified hypothyroidism: Secondary | ICD-10-CM

## 2021-03-07 LAB — THYROID PEROXIDASE ANTIBODIES (TPO) (REFL): Thyroperoxidase Ab SerPl-aCnc: <1 IU/mL (ref <9)

## 2021-04-26 DIAGNOSIS — D2230 Melanocytic nevi of unspecified part of face: Secondary | ICD-10-CM | POA: Diagnosis not present

## 2021-04-26 DIAGNOSIS — D2272 Melanocytic nevi of left lower limb, including hip: Secondary | ICD-10-CM | POA: Diagnosis not present

## 2021-04-26 DIAGNOSIS — L918 Other hypertrophic disorders of the skin: Secondary | ICD-10-CM | POA: Diagnosis not present

## 2021-04-26 DIAGNOSIS — L639 Alopecia areata, unspecified: Secondary | ICD-10-CM | POA: Diagnosis not present

## 2021-04-26 DIAGNOSIS — D225 Melanocytic nevi of trunk: Secondary | ICD-10-CM | POA: Diagnosis not present

## 2021-04-26 DIAGNOSIS — L719 Rosacea, unspecified: Secondary | ICD-10-CM | POA: Diagnosis not present

## 2021-04-26 DIAGNOSIS — D224 Melanocytic nevi of scalp and neck: Secondary | ICD-10-CM | POA: Diagnosis not present

## 2021-04-26 DIAGNOSIS — L821 Other seborrheic keratosis: Secondary | ICD-10-CM | POA: Diagnosis not present

## 2021-04-26 DIAGNOSIS — L578 Other skin changes due to chronic exposure to nonionizing radiation: Secondary | ICD-10-CM | POA: Diagnosis not present

## 2021-05-02 ENCOUNTER — Other Ambulatory Visit: Payer: Self-pay | Admitting: Family

## 2021-05-02 ENCOUNTER — Other Ambulatory Visit (HOSPITAL_COMMUNITY): Payer: Self-pay

## 2021-05-02 ENCOUNTER — Encounter: Payer: Self-pay | Admitting: Family

## 2021-05-02 ENCOUNTER — Other Ambulatory Visit: Payer: Self-pay

## 2021-05-02 MED ORDER — SERTRALINE HCL 50 MG PO TABS
50.0000 mg | ORAL_TABLET | Freq: Every day | ORAL | 1 refills | Status: DC
  Filled 2021-05-02: qty 90, 90d supply, fill #0
  Filled 2021-11-03: qty 90, 90d supply, fill #1

## 2021-05-04 ENCOUNTER — Other Ambulatory Visit: Payer: Self-pay

## 2021-05-04 ENCOUNTER — Other Ambulatory Visit (HOSPITAL_COMMUNITY): Payer: Self-pay

## 2021-05-27 DIAGNOSIS — H04123 Dry eye syndrome of bilateral lacrimal glands: Secondary | ICD-10-CM | POA: Diagnosis not present

## 2021-05-27 DIAGNOSIS — H1789 Other corneal scars and opacities: Secondary | ICD-10-CM | POA: Diagnosis not present

## 2021-05-27 DIAGNOSIS — H35411 Lattice degeneration of retina, right eye: Secondary | ICD-10-CM | POA: Diagnosis not present

## 2021-05-27 DIAGNOSIS — H1045 Other chronic allergic conjunctivitis: Secondary | ICD-10-CM | POA: Diagnosis not present

## 2021-05-27 DIAGNOSIS — H52223 Regular astigmatism, bilateral: Secondary | ICD-10-CM | POA: Diagnosis not present

## 2021-08-03 ENCOUNTER — Other Ambulatory Visit (HOSPITAL_COMMUNITY): Payer: Self-pay

## 2021-08-03 MED ORDER — CARESTART COVID-19 HOME TEST VI KIT
PACK | 0 refills | Status: DC
  Filled 2021-08-03: qty 4, 4d supply, fill #0

## 2021-08-16 ENCOUNTER — Other Ambulatory Visit (HOSPITAL_COMMUNITY): Payer: Self-pay

## 2021-08-16 ENCOUNTER — Other Ambulatory Visit (HOSPITAL_BASED_OUTPATIENT_CLINIC_OR_DEPARTMENT_OTHER): Payer: Self-pay

## 2021-08-16 MED ORDER — INFLUENZA VAC SPLIT QUAD 0.5 ML IM SUSY
PREFILLED_SYRINGE | INTRAMUSCULAR | 0 refills | Status: DC
  Filled 2021-08-16: qty 0.5, 1d supply, fill #0

## 2021-08-17 ENCOUNTER — Other Ambulatory Visit (HOSPITAL_BASED_OUTPATIENT_CLINIC_OR_DEPARTMENT_OTHER): Payer: Self-pay

## 2021-10-03 ENCOUNTER — Other Ambulatory Visit (HOSPITAL_COMMUNITY): Payer: Self-pay

## 2021-10-03 ENCOUNTER — Other Ambulatory Visit (HOSPITAL_BASED_OUTPATIENT_CLINIC_OR_DEPARTMENT_OTHER): Payer: Self-pay

## 2021-10-03 ENCOUNTER — Telehealth: Payer: 59 | Admitting: Physician Assistant

## 2021-10-03 DIAGNOSIS — J209 Acute bronchitis, unspecified: Secondary | ICD-10-CM | POA: Diagnosis not present

## 2021-10-03 MED ORDER — DOXYCYCLINE HYCLATE 100 MG PO TABS
100.0000 mg | ORAL_TABLET | Freq: Two times a day (BID) | ORAL | 0 refills | Status: DC
  Filled 2021-10-03 (×2): qty 14, 7d supply, fill #0

## 2021-10-03 MED ORDER — BENZONATATE 100 MG PO CAPS
100.0000 mg | ORAL_CAPSULE | Freq: Three times a day (TID) | ORAL | 0 refills | Status: DC | PRN
  Filled 2021-10-03 (×2): qty 30, 10d supply, fill #0

## 2021-10-03 NOTE — Patient Instructions (Signed)
[name], thank you for joining Leeanne Rio, PA-C for today's virtual visit.  While this provider is not your primary care provider (PCP), if your PCP is located in our provider database this encounter information will be shared with them immediately following your visit.  Consent: (Patient) [name] provided verbal consent for this virtual visit at the beginning of the encounter.  Current Medications:  Current Outpatient Medications:    cholecalciferol (VITAMIN D) 1000 units tablet, Take 1,000 Units by mouth daily., Disp: , Rfl:    COVID-19 At Home Antigen Test [facility] COVID-19 HOME TEST) KIT, Use as directed within package instructions., Disp: 4 each, Rfl: 0   COVID-19 At Home Antigen Test KIT, USE AS DIRECTED WITHIN PACKAGE INSTRUCTIONS (Patient taking differently: USE AS DIRECTED WITHIN PACKAGE INSTRUCTIONS), Disp: 4 kit, Rfl: 0   docusate sodium (COLACE) 100 MG capsule, Take 100 mg by mouth daily as needed for mild constipation., Disp: , Rfl:    famotidine (PEPCID) 20 MG tablet, Take 20 mg by mouth daily as needed. , Disp: , Rfl:    fluticasone (FLONASE) 50 MCG/ACT nasal spray, Place 1 spray into both nostrils daily., Disp: 16 g, Rfl: 5   ibuprofen (ADVIL,MOTRIN) 600 MG tablet, Take 1 tablet (600 mg total) by mouth every 6 (six) hours., Disp: 30 tablet, Rfl: 0   influenza vac split quadrivalent PF (FLUARIX) 0.5 ML injection, Inject into the muscle., Disp: 0.5 mL, Rfl: 0   loratadine (CLARITIN) 10 MG tablet, Take 10 mg by mouth daily as needed for allergies., Disp: , Rfl:    sertraline (ZOLOFT) 50 MG tablet, Take 1 tablet (50 mg total) by mouth daily., Disp: 90 tablet, Rfl: 1   Medications ordered in this encounter:  No orders of the defined types were placed in this encounter.    *If you need refills on other medications prior to your next appointment, please contact your pharmacy*  Follow-Up: Call back or seek an in-person evaluation if the symptoms worsen or  if the condition fails to improve as anticipated.  Other Instructions Take antibiotic (Doxycycline) as directed.  Increase fluids.  Get plenty of rest. Use Mucinex for congestion. Use the Tessalon as directed. Take a daily probiotic (I recommend Align or Culturelle, but even Activia Yogurt may be beneficial).  A humidifier placed in the bedroom may offer some relief for a dry, scratchy throat of nasal irritation.  Read information below on acute bronchitis. Please call or return to clinic if symptoms are not improving.  Acute Bronchitis Bronchitis is when the airways that extend from the windpipe into the lungs get red, puffy, and painful (inflamed). Bronchitis often causes thick spit (mucus) to develop. This leads to a cough. A cough is the most common symptom of bronchitis. In acute bronchitis, the condition usually begins suddenly and goes away over time (usually in 2 weeks). Smoking, allergies, and asthma can make bronchitis worse. Repeated episodes of bronchitis may cause more lung problems.  HOME CARE Rest. Drink enough fluids to keep your pee (urine) clear or pale yellow (unless you need to limit fluids as told by your doctor). Only take over-the-counter or prescription medicines as told by your doctor. Avoid smoking and secondhand smoke. These can make bronchitis worse. If you are a smoker, think about using nicotine gum or skin patches. Quitting smoking will help your lungs heal faster. Reduce the chance of getting bronchitis again by: Washing your hands often. Avoiding people with cold symptoms. Trying not to touch your  hands to your mouth, nose, or eyes. Follow up with your doctor as told.  GET HELP IF: Your symptoms do not improve after 1 week of treatment. Symptoms include: Cough. Fever. Coughing up thick spit. Body aches. Chest congestion. Chills. Shortness of breath. Sore throat.  GET HELP RIGHT AWAY IF:  You have an increased fever. You have chills. You have severe  shortness of breath. You have bloody thick spit (sputum). You throw up (vomit) often. You lose too much body fluid (dehydration). You have a severe headache. You faint.  MAKE SURE YOU:  Understand these instructions. Will watch your condition. Will get help right away if you are not doing well or get worse. Document Released: 05/01/2008 Document Revised: 07/16/2013 Document Reviewed: 05/06/2013 [facility] Patient Information 2015 [place], [place]. This information is not intended to replace advice given to you by your health care provider. Make sure you discuss any questions you have with your health care provider.    If you have been instructed to have an in-person evaluation today at a local Urgent Care facility, please use the link below. It will take you to a list of all of our available [facility], including address, phone number and hours of operation. Please do not delay care.  [facility]  If you or a family member do not have a primary care provider, use the link below to schedule a visit and establish care. When you choose a [facility] primary care physician or advanced practice provider, you gain a long-term partner in health. Find a Primary Care Provider  Learn more about [facility]'s in-office and virtual care options: [facility]

## 2021-10-03 NOTE — Progress Notes (Signed)
Virtual Visit Consent   [name], you are scheduled for a virtual visit with a [facility] provider today.     Just as with appointments in the office, your consent must be obtained to participate.  Your consent will be active for this visit and any virtual visit you may have with one of our providers in the next 365 days.     If you have a MyChart account, a copy of this consent can be sent to you electronically.  All virtual visits are billed to your insurance company just like a traditional visit in the office.    As this is a virtual visit, video technology does not allow for your provider to perform a traditional examination.  This may limit your provider's ability to fully assess your condition.  If your provider identifies any concerns that need to be evaluated in person or the need to arrange testing (such as labs, EKG, etc.), we will make arrangements to do so.     Although advances in technology are sophisticated, we cannot ensure that it will always work on either your end or our end.  If the connection with a video visit is poor, the visit may have to be switched to a telephone visit.  With either a video or telephone visit, we are not always able to ensure that we have a secure connection.     I need to obtain your verbal consent now.   Are you willing to proceed with your visit today?    [name] has provided verbal consent on 10/03/2021 for a virtual visit (video or telephone).   Leeanne Rio, [place]   Date: 10/03/2021 8:38 AM   Virtual Visit via Video Note   I, Leeanne Rio, connected with  [name]  ([id number], 1976-06-04) on 10/03/21 at  8:30 AM EST by a video-enabled telemedicine application and verified that I am speaking with the correct person using two identifiers.  Location: Patient: Virtual Visit Location Patient: Home Provider: Virtual Visit Location Provider: Home Office   I discussed the limitations of evaluation and management by  telemedicine and the availability of in person appointments. The patient expressed understanding and agreed to proceed.    History of Present Illness: [name] is a 45 y.o. who identifies as a female who was assigned female at birth, and is being seen today for URI symptoms. Notes her children are currently sick with URI. Patient endorses her symptoms starting with symptoms early last week with scratchy throat turning into a cough that was initially dry. As of Friday started with low-grade fever (< 101) that has continued since then. Cough is now productive. Does not some nasal congestion but milder. Is taking Mucinex to help thin her chest congestion. Notes some L ear pressure/popping along with this. Denies chest pain or overt SOB.   HPI: HPI  Problems:  Patient Active Problem List   Diagnosis Date Noted   Subclinical hypothyroidism 10/18/2018   Indication for care in labor or delivery 04/15/2015   Postpartum care following vaginal delivery (5/19) 04/15/2015   Anxiety state, unspecified 12/11/2013   Rosacea 04/11/2011   HYPERCALCEMIA 03/01/2007   ALLERGIC RHINITIS, HX OF 03/01/2007   OSTEOPENIA 03/01/2007    Allergies:  Allergies  Allergen Reactions   Azithromycin Nausea And Vomiting and Other (See Comments)    DIZZINESS   Sulfa Antibiotics Rash   Medications:  Current Outpatient Medications:    benzonatate (TESSALON) 100 MG capsule, Take  1 capsule (100 mg total) by mouth 3 (three) times daily as needed for cough., Disp: 30 capsule, Rfl: 0   doxycycline (VIBRA-TABS) 100 MG tablet, Take 1 tablet (100 mg total) by mouth 2 (two) times daily., Disp: 14 tablet, Rfl: 0   cholecalciferol (VITAMIN D) 1000 units tablet, Take 1,000 Units by mouth daily., Disp: , Rfl:    COVID-19 At Home Antigen Test [facility] COVID-19 HOME TEST) KIT, Use as directed within package instructions., Disp: 4 each, Rfl: 0   COVID-19 At Home Antigen Test KIT, USE AS DIRECTED WITHIN PACKAGE INSTRUCTIONS  (Patient taking differently: USE AS DIRECTED WITHIN PACKAGE INSTRUCTIONS), Disp: 4 kit, Rfl: 0   docusate sodium (COLACE) 100 MG capsule, Take 100 mg by mouth daily as needed for mild constipation., Disp: , Rfl:    famotidine (PEPCID) 20 MG tablet, Take 20 mg by mouth daily as needed. , Disp: , Rfl:    fluticasone (FLONASE) 50 MCG/ACT nasal spray, Place 1 spray into both nostrils daily., Disp: 16 g, Rfl: 5   ibuprofen (ADVIL,MOTRIN) 600 MG tablet, Take 1 tablet (600 mg total) by mouth every 6 (six) hours., Disp: 30 tablet, Rfl: 0   influenza vac split quadrivalent PF (FLUARIX) 0.5 ML injection, Inject into the muscle., Disp: 0.5 mL, Rfl: 0   loratadine (CLARITIN) 10 MG tablet, Take 10 mg by mouth daily as needed for allergies., Disp: , Rfl:    sertraline (ZOLOFT) 50 MG tablet, Take 1 tablet (50 mg total) by mouth daily., Disp: 90 tablet, Rfl: 1  Observations/Objective: Patient is well-developed, well-nourished in no acute distress.  Resting comfortably at home.  Head is normocephalic, atraumatic.  No labored breathing. Speech is clear and coherent with logical content.  Patient is alert and oriented at baseline.   Assessment and Plan: 1. Acute bronchitis, unspecified organism - doxycycline (VIBRA-TABS) 100 MG tablet; Take 1 tablet (100 mg total) by mouth 2 (two) times daily.  Dispense: 14 tablet; Refill: 0 - benzonatate (TESSALON) 100 MG capsule; Take 1 capsule (100 mg total) by mouth 3 (three) times daily as needed for cough.  Dispense: 30 capsule; Refill: 0 Rx Doxycycline.  Increase fluids.  Rest.  Saline nasal spray.  Probiotic.  Mucinex as directed.  Humidifier in bedroom. Tessalon per orders.  Call or return to clinic if symptoms are not improving.   Follow Up Instructions: I discussed the assessment and treatment plan with the patient. The patient was provided an opportunity to ask questions and all were answered. The patient agreed with the plan and demonstrated an understanding of  the instructions.  A copy of instructions were sent to the patient via MyChart unless otherwise noted below.   The patient was advised to call back or seek an in-person evaluation if the symptoms worsen or if the condition fails to improve as anticipated.  Time:  I spent 10 minutes with the patient via telehealth technology discussing the above problems/concerns.    Leeanne Rio, PA-C

## 2021-10-13 ENCOUNTER — Other Ambulatory Visit (HOSPITAL_COMMUNITY): Payer: Self-pay

## 2021-10-30 ENCOUNTER — Telehealth: Payer: 59 | Admitting: Nurse Practitioner

## 2021-10-30 ENCOUNTER — Encounter: Payer: Self-pay | Admitting: Nurse Practitioner

## 2021-10-30 DIAGNOSIS — J019 Acute sinusitis, unspecified: Secondary | ICD-10-CM

## 2021-10-30 DIAGNOSIS — B309 Viral conjunctivitis, unspecified: Secondary | ICD-10-CM

## 2021-10-30 MED ORDER — OLOPATADINE HCL 0.1 % OP SOLN: 1.0000 [drp] | Freq: Two times a day (BID) | OPHTHALMIC | 12 refills | Status: AC

## 2021-10-30 MED ORDER — FLUTICASONE PROPIONATE 50 MCG/ACT NA SUSP: 2.0000 | Freq: Every day | NASAL | 0 refills | Status: DC

## 2021-10-30 MED ORDER — AMOXICILLIN-POT CLAVULANATE 875-125 MG PO TABS: 1.0000 | ORAL_TABLET | Freq: Two times a day (BID) | ORAL | 0 refills | Status: DC

## 2021-10-30 MED ORDER — PSEUDOEPH-BROMPHEN-DM 30-2-10 MG/5ML PO SYRP: 5.0000 mL | ORAL_SOLUTION | Freq: Four times a day (QID) | ORAL | 0 refills | Status: AC | PRN

## 2021-10-30 NOTE — Progress Notes (Signed)
Virtual Visit Consent   [name], you are scheduled for a virtual visit with a [facility] provider today.     Just as with appointments in the office, your consent must be obtained to participate.  Your consent will be active for this visit and any virtual visit you may have with one of our providers in the next 365 days.     If you have a MyChart account, a copy of this consent can be sent to you electronically.  All virtual visits are billed to your insurance company just like a traditional visit in the office.    As this is a virtual visit, video technology does not allow for your provider to perform a traditional examination.  This may limit your provider's ability to fully assess your condition.  If your provider identifies any concerns that need to be evaluated in person or the need to arrange testing (such as labs, EKG, etc.), we will make arrangements to do so.     Although advances in technology are sophisticated, we cannot ensure that it will always work on either your end or our end.  If the connection with a video visit is poor, the visit may have to be switched to a telephone visit.  With either a video or telephone visit, we are not always able to ensure that we have a secure connection.     I need to obtain your verbal consent now.   Are you willing to proceed with your visit today? Yes   [name] has provided verbal consent on 10/30/2021 for a virtual visit (video or telephone).   Reynolds Bowl, NP   Date: 10/30/2021 9:42 AM   Virtual Visit via Video Note   I, Reynolds Bowl, connected with  [name]  ([id number], 1976/09/22) on 10/30/21 at  9:45 AM EST by a video-enabled telemedicine application and verified that I am speaking with the correct person using two identifiers.  Location: Patient: Virtual Visit Location Patient: Home Provider: Virtual Visit Location Provider: Home   I discussed the limitations of evaluation and management by telemedicine and  the availability of in person appointments. The patient expressed understanding and agreed to proceed.    History of Present Illness: [name] is a 45 y.o. who identifies as a female who was assigned female at birth, and is being seen today for upper respiratory symptoms. Symptoms started approximately 6 days ago with fever, sore throat, productive cough, sinus pressure and bilateral ear fullness/pressure. On yesterday, she developed bilateral eye symptoms of redness, matting, dryness, swelling .and tearing. She has used the netti pot, Delsym, Mucinex and Ibuprofen. She has been applying warm compresses to her eyes and using OTC eye drops.   HPI: HPI  Problems:  Patient Active Problem List   Diagnosis Date Noted   Subclinical hypothyroidism 10/18/2018   Indication for care in labor or delivery 04/15/2015   Postpartum care following vaginal delivery (5/19) 04/15/2015   Anxiety state, unspecified 12/11/2013   Rosacea 04/11/2011   HYPERCALCEMIA 03/01/2007   ALLERGIC RHINITIS, HX OF 03/01/2007   OSTEOPENIA 03/01/2007    Allergies:  Allergies  Allergen Reactions   Azithromycin Nausea And Vomiting and Other (See Comments)    DIZZINESS   Sulfa Antibiotics Rash   Medications:  Current Outpatient Medications:    benzonatate (TESSALON) 100 MG capsule, Take 1 capsule (100 mg total) by mouth 3 (three) times daily as needed for cough., Disp: 30 capsule, Rfl: 0   cholecalciferol (  VITAMIN D) 1000 units tablet, Take 1,000 Units by mouth daily., Disp: , Rfl:    COVID-19 At Home Antigen Test [facility] COVID-19 HOME TEST) KIT, Use as directed within package instructions., Disp: 4 each, Rfl: 0   COVID-19 At Home Antigen Test KIT, USE AS DIRECTED WITHIN PACKAGE INSTRUCTIONS (Patient taking differently: USE AS DIRECTED WITHIN PACKAGE INSTRUCTIONS), Disp: 4 kit, Rfl: 0   docusate sodium (COLACE) 100 MG capsule, Take 100 mg by mouth daily as needed for mild constipation., Disp: , Rfl:    doxycycline  (VIBRA-TABS) 100 MG tablet, Take 1 tablet (100 mg total) by mouth 2 (two) times daily., Disp: 14 tablet, Rfl: 0   famotidine (PEPCID) 20 MG tablet, Take 20 mg by mouth daily as needed. , Disp: , Rfl:    fluticasone (FLONASE) 50 MCG/ACT nasal spray, Place 1 spray into both nostrils daily., Disp: 16 g, Rfl: 5   ibuprofen (ADVIL,MOTRIN) 600 MG tablet, Take 1 tablet (600 mg total) by mouth every 6 (six) hours., Disp: 30 tablet, Rfl: 0   influenza vac split quadrivalent PF (FLUARIX) 0.5 ML injection, Inject into the muscle., Disp: 0.5 mL, Rfl: 0   loratadine (CLARITIN) 10 MG tablet, Take 10 mg by mouth daily as needed for allergies., Disp: , Rfl:    sertraline (ZOLOFT) 50 MG tablet, Take 1 tablet (50 mg total) by mouth daily., Disp: 90 tablet, Rfl: 1  Observations/Objective: Patient is well-developed, well-nourished in no acute distress.  Resting comfortably at home.  Head is normocephalic, atraumatic.  No labored breathing.  Speech is clear and coherent with logical content.  Patient is alert and oriented at baseline.    Assessment and Plan: 1. Acute sinusitis, recurrence not specified, unspecified location - amoxicillin-clavulanate (AUGMENTIN) 875-125 MG tablet; Take 1 tablet by mouth 2 (two) times daily.  Dispense: 20 tablet; Refill: 0 - fluticasone (FLONASE) 50 MCG/ACT nasal spray; Place 2 sprays into both nostrils daily for 10 days.  Dispense: 16 g; Refill: 0 - brompheniramine-pseudoephedrine-DM 30-2-10 MG/5ML syrup; Take 5 mLs by mouth 4 (four) times daily as needed for up to 7 days.  Dispense: 140 mL; Refill: 0  2. Acute viral conjunctivitis of both eyes - olopatadine (PATANOL) 0.1 % ophthalmic solution; Place 1 drop into both eyes 2 (two) times daily for 10 days.  Dispense: 5 mL; Refill: 12  Symptoms suggest bacterial sinusitis based on presence of fever, symptom duration,mucopurulent drainage. Will treat with Augmentin and Flonase. May continue use of netti pot. Recommend use of  humidifier and sleeping on 2 pillows at bedtime. Get rest and drink plenty of fluids. Use Pataday as directed. Avoid rubbing, irritating the eyes. Continue with cool compresses to help with eye discomfort. Do not use eye make-up or contacts while symptoms persist.  Folllow-up with PCP if symptoms continue to persist.   Follow Up Instructions: I discussed the assessment and treatment plan with the patient. The patient was provided an opportunity to ask questions and all were answered. The patient agreed with the plan and demonstrated an understanding of the instructions.  A copy of instructions were sent to the patient via MyChart unless otherwise noted below.   The patient was advised to call back or seek an in-person evaluation if the symptoms worsen or if the condition fails to improve as anticipated.  Time:  I spent 15 minutes with the patient via telehealth technology discussing the above problems/concerns.    Reynolds Bowl, NP

## 2021-10-30 NOTE — Patient Instructions (Addendum)
[name], thank you for joining Reynolds Bowl, NP for today's virtual visit.  While this provider is not your primary care provider (PCP), if your PCP is located in our provider database this encounter information will be shared with them immediately following your visit.  Consent: (Patient) [name] provided verbal consent for this virtual visit at the beginning of the encounter.  Current Medications:  Current Outpatient Medications:    amoxicillin-clavulanate (AUGMENTIN) 875-125 MG tablet, Take 1 tablet by mouth 2 (two) times daily., Disp: 20 tablet, Rfl: 0   brompheniramine-pseudoephedrine-DM 30-2-10 MG/5ML syrup, Take 5 mLs by mouth 4 (four) times daily as needed for up to 7 days., Disp: 140 mL, Rfl: 0   fluticasone (FLONASE) 50 MCG/ACT nasal spray, Place 2 sprays into both nostrils daily for 10 days., Disp: 16 g, Rfl: 0   olopatadine (PATANOL) 0.1 % ophthalmic solution, Place 1 drop into both eyes 2 (two) times daily for 10 days., Disp: 5 mL, Rfl: 12   benzonatate (TESSALON) 100 MG capsule, Take 1 capsule (100 mg total) by mouth 3 (three) times daily as needed for cough., Disp: 30 capsule, Rfl: 0   cholecalciferol (VITAMIN D) 1000 units tablet, Take 1,000 Units by mouth daily., Disp: , Rfl:    COVID-19 At Home Antigen Test [facility] COVID-19 HOME TEST) KIT, Use as directed within package instructions., Disp: 4 each, Rfl: 0   COVID-19 At Home Antigen Test KIT, USE AS DIRECTED WITHIN PACKAGE INSTRUCTIONS (Patient taking differently: USE AS DIRECTED WITHIN PACKAGE INSTRUCTIONS), Disp: 4 kit, Rfl: 0   docusate sodium (COLACE) 100 MG capsule, Take 100 mg by mouth daily as needed for mild constipation., Disp: , Rfl:    doxycycline (VIBRA-TABS) 100 MG tablet, Take 1 tablet (100 mg total) by mouth 2 (two) times daily., Disp: 14 tablet, Rfl: 0   famotidine (PEPCID) 20 MG tablet, Take 20 mg by mouth daily as needed. , Disp: , Rfl:    fluticasone (FLONASE) 50 MCG/ACT nasal spray, Place 1  spray into both nostrils daily., Disp: 16 g, Rfl: 5   ibuprofen (ADVIL,MOTRIN) 600 MG tablet, Take 1 tablet (600 mg total) by mouth every 6 (six) hours., Disp: 30 tablet, Rfl: 0   influenza vac split quadrivalent PF (FLUARIX) 0.5 ML injection, Inject into the muscle., Disp: 0.5 mL, Rfl: 0   loratadine (CLARITIN) 10 MG tablet, Take 10 mg by mouth daily as needed for allergies., Disp: , Rfl:    sertraline (ZOLOFT) 50 MG tablet, Take 1 tablet (50 mg total) by mouth daily., Disp: 90 tablet, Rfl: 1   Medications ordered in this encounter:  Meds ordered this encounter  Medications   amoxicillin-clavulanate (AUGMENTIN) 875-125 MG tablet    Sig: Take 1 tablet by mouth 2 (two) times daily.    Dispense:  20 tablet    Refill:  0   fluticasone (FLONASE) 50 MCG/ACT nasal spray    Sig: Place 2 sprays into both nostrils daily for 10 days.    Dispense:  16 g    Refill:  0   brompheniramine-pseudoephedrine-DM 30-2-10 MG/5ML syrup    Sig: Take 5 mLs by mouth 4 (four) times daily as needed for up to 7 days.    Dispense:  140 mL    Refill:  0   olopatadine (PATANOL) 0.1 % ophthalmic solution    Sig: Place 1 drop into both eyes 2 (two) times daily for 10 days.    Dispense:  5 mL    Refill:  12     *  If you need refills on other medications prior to your next appointment, please contact your pharmacy*  Follow-Up: Call back or seek an in-person evaluation if the symptoms worsen or if the condition fails to improve as anticipated.  Other Instructions Take medication as prescribed. Continue use of netti pot, use humidifier, sleep elevated on 2 pillows. Get plenty of rest and increase fluids. Avoid rubbing or irritating the eyes. Use strict hand hygiene until symptoms resolve. Avoid use of eye make-up or contacts while symptoms persist. Follow up with PCP if symptoms do not improve.    If you have been instructed to have an in-person evaluation today at a local Urgent Care facility, please use the link  below. It will take you to a list of all of our available [facility], including address, phone number and hours of operation. Please do not delay care.  [facility]  If you or a family member do not have a primary care provider, use the link below to schedule a visit and establish care. When you choose a [facility] primary care physician or advanced practice provider, you gain a long-term partner in health. Find a Primary Care Provider  Learn more about [facility]'s in-office and virtual care options: [facility]

## 2021-10-31 ENCOUNTER — Encounter: Payer: Self-pay | Admitting: Family

## 2021-10-31 ENCOUNTER — Other Ambulatory Visit (HOSPITAL_BASED_OUTPATIENT_CLINIC_OR_DEPARTMENT_OTHER): Payer: Self-pay

## 2021-10-31 MED ORDER — AMOXICILLIN-POT CLAVULANATE 875-125 MG PO TABS
ORAL_TABLET | ORAL | 0 refills | Status: DC
  Filled 2021-10-31: qty 20, 10d supply, fill #0

## 2021-10-31 MED ORDER — FLUTICASONE PROPIONATE 50 MCG/ACT NA SUSP
NASAL | 0 refills | Status: AC
  Filled 2021-10-31: qty 16, 30d supply, fill #0

## 2021-10-31 MED ORDER — PSEUDOEPH-BROMPHEN-DM 30-2-10 MG/5ML PO SYRP
ORAL_SOLUTION | ORAL | 0 refills | Status: DC
  Filled 2021-10-31: qty 118, 6d supply, fill #0
  Filled 2022-01-05: qty 118, 6d supply, fill #1
  Filled 2022-01-06: qty 22, 1d supply, fill #1

## 2021-10-31 MED ORDER — OLOPATADINE HCL 0.1 % OP SOLN: OPHTHALMIC | 12 refills | Status: DC

## 2021-11-04 ENCOUNTER — Other Ambulatory Visit (HOSPITAL_COMMUNITY): Payer: Self-pay

## 2021-11-08 ENCOUNTER — Other Ambulatory Visit (HOSPITAL_COMMUNITY): Payer: Self-pay

## 2021-11-08 MED ORDER — CARESTART COVID-19 HOME TEST VI KIT
PACK | 0 refills | Status: DC
  Filled 2021-11-08: qty 4, 4d supply, fill #0

## 2021-11-13 IMAGING — MG MM DIGITAL DIAGNOSTIC UNILAT*R* W/ TOMO W/ CAD
6 series · 6 of 18 positions shown · non-contrast
Comparison: Previous exam(s).

CLINICAL DATA: Mass with some tenderness felt by the patient in the
upper outer right breast for the past month.

EXAM:
DIGITAL DIAGNOSTIC RIGHT MAMMOGRAM WITH CAD AND TOMO
ULTRASOUND RIGHT BREAST

[R MLO synth-2D]
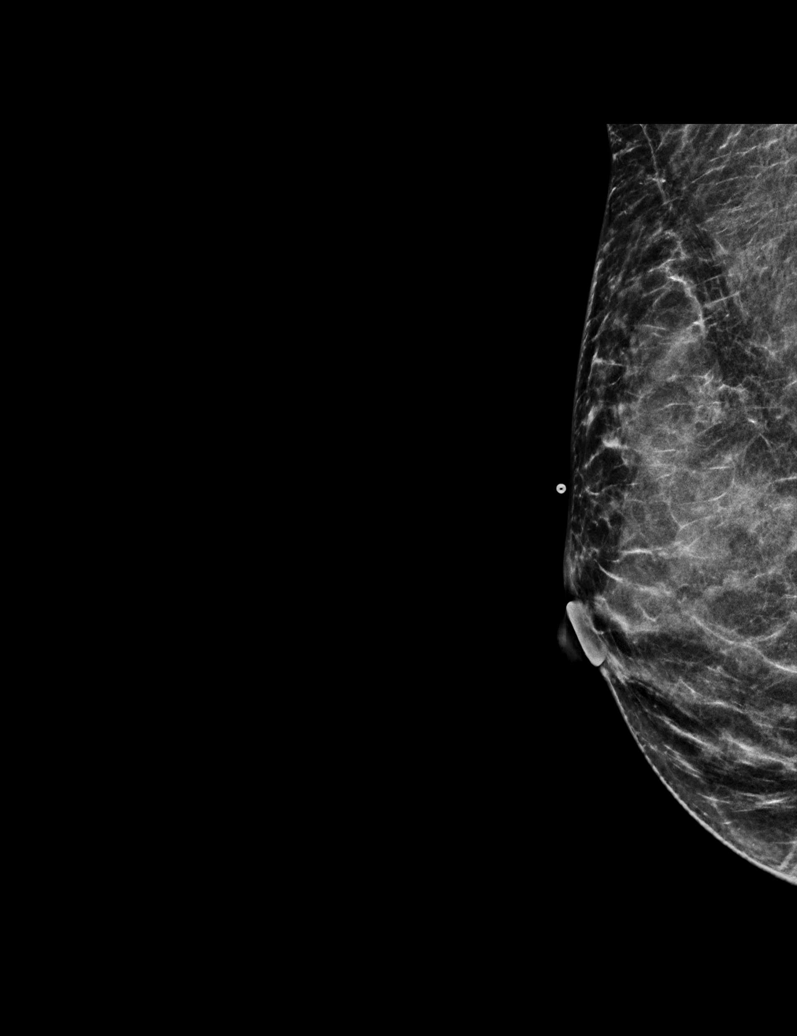

[R TAN synth-2D]
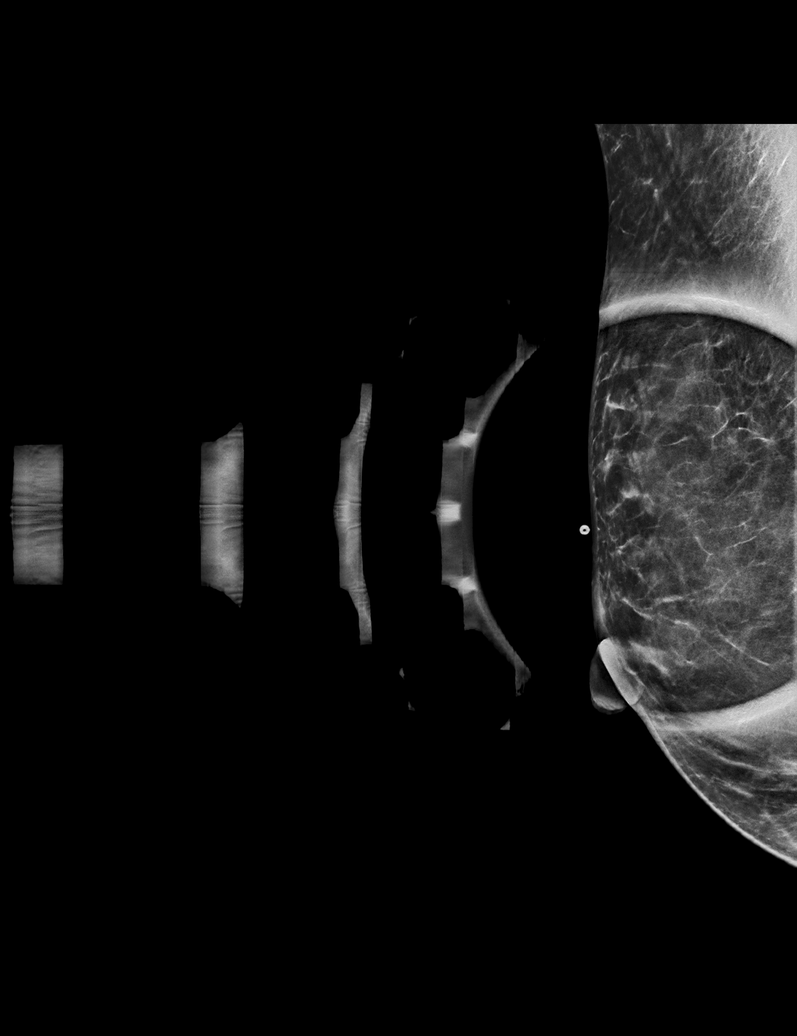

[R CC synth-2D]
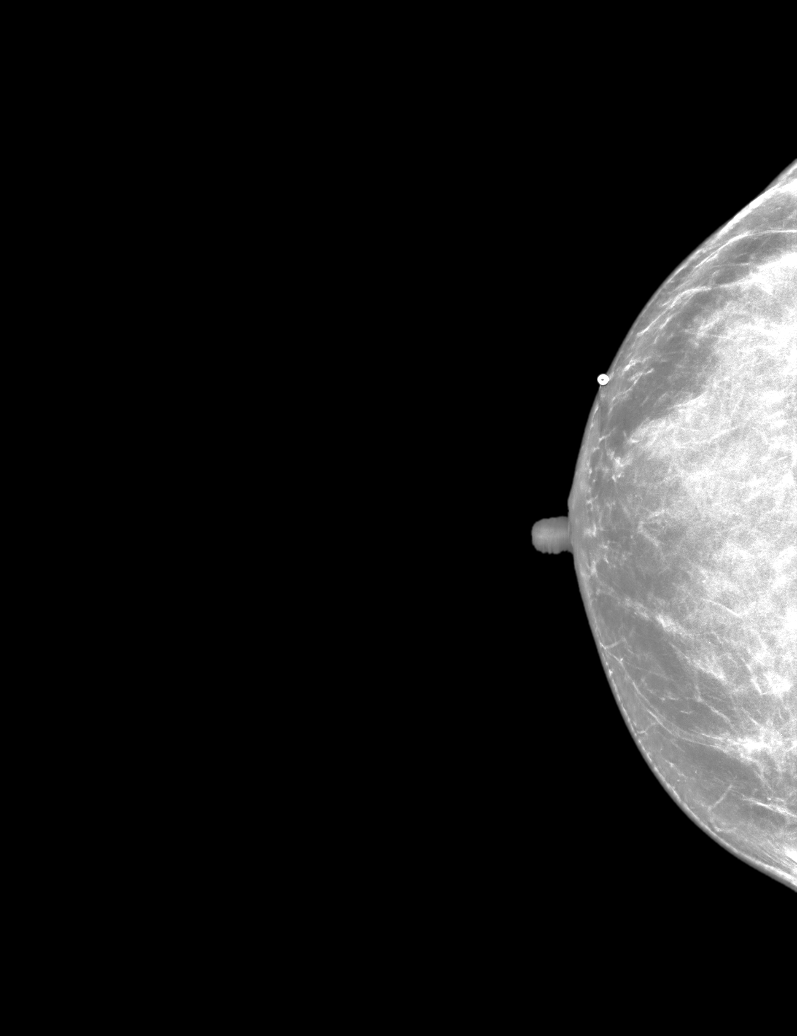

[R TAN tomo · tomo slice 25/50.0]
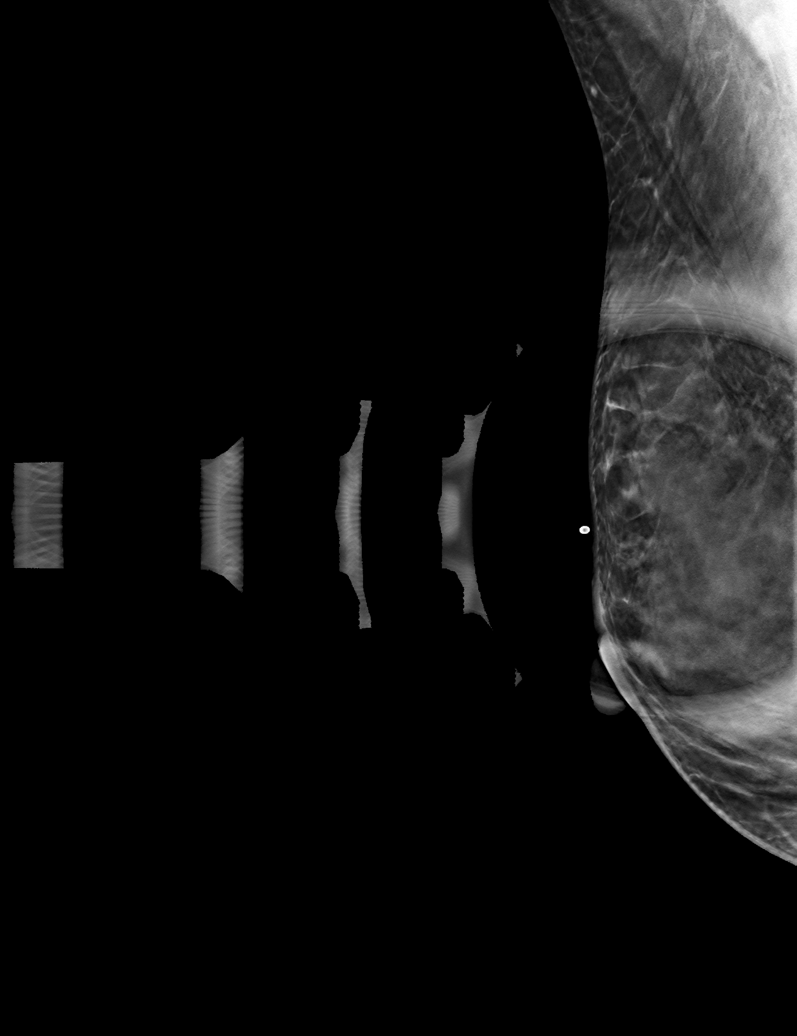

[R CC tomo · tomo slice 31/61.0]
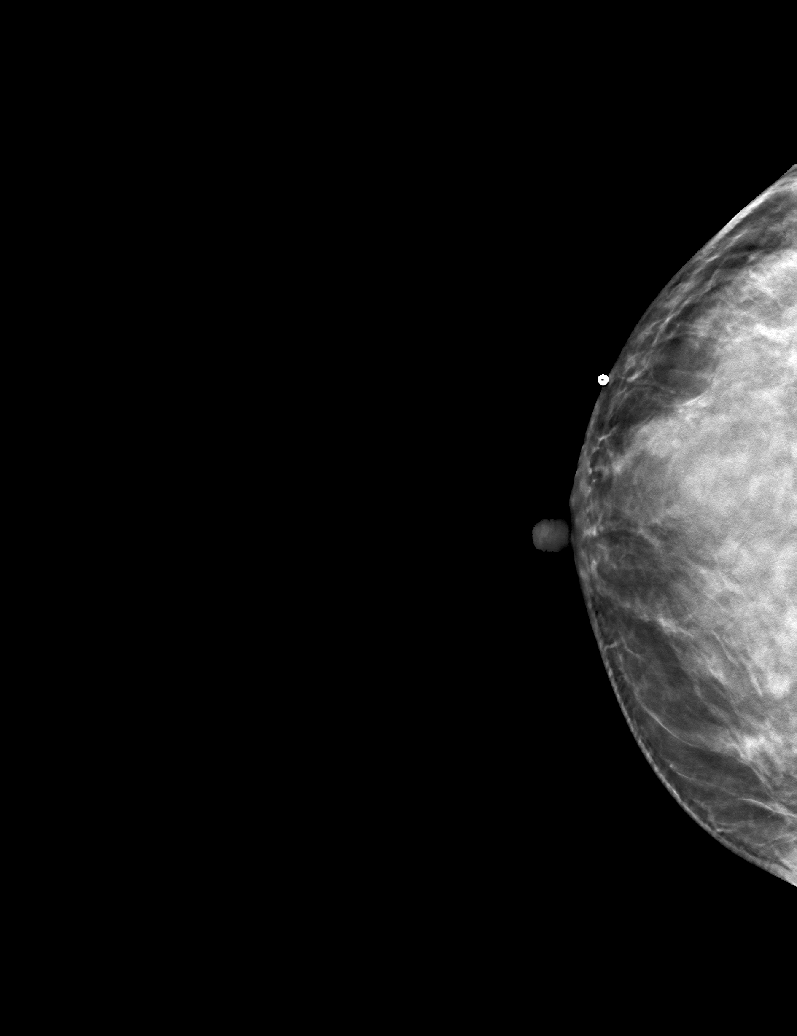

[R MLO tomo · tomo slice 25/50.0]
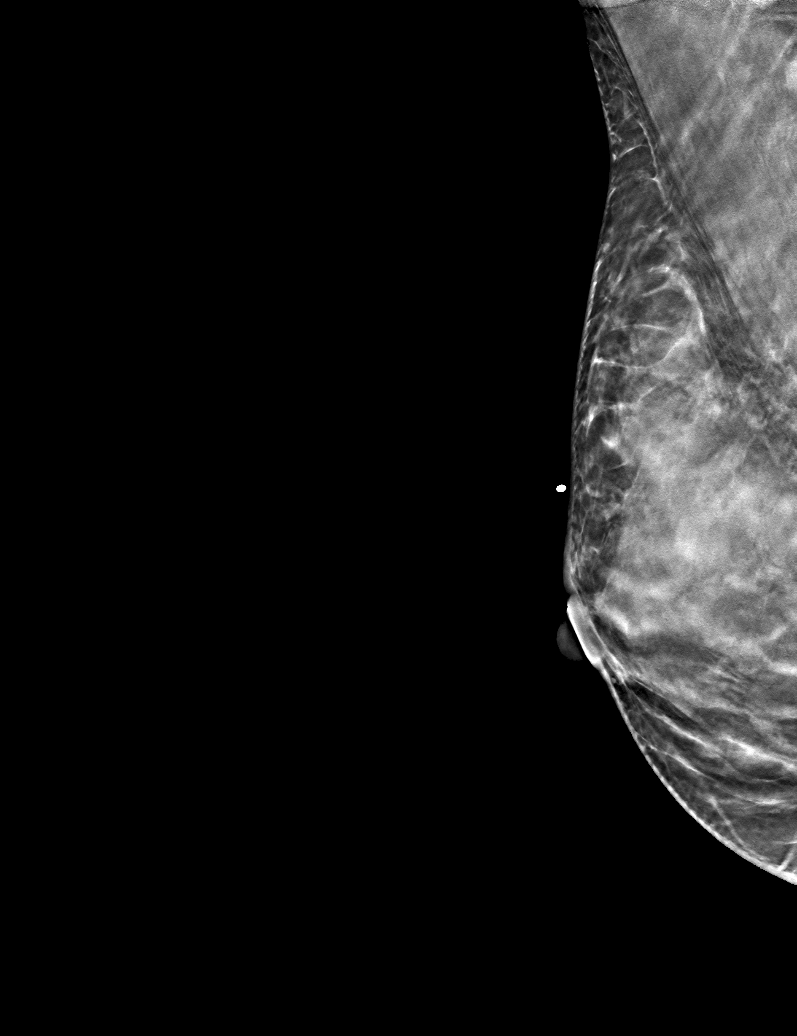

[6 of 18 positions shown; findings below may reference images not displayed]

ACR Breast Density Category d: The breast tissue is extremely dense,
which lowers the sensitivity of mammography.
FINDINGS: Stable mammographic appearance of the right mass with no visible
mass or other findings suspicious for malignancy. The palpable mass
is at the location of a mass felt by the patient and evaluated with
mammography and ultrasound on 12/05/2013. At that time, a 3 mm cyst
was seen at that location.

Mammographic images were processed with CAD.

On physical exam, the patient has an oval area of palpable soft
tissue thickening in the lateral periareolar right breast with
associated tenderness.

Targeted ultrasound is performed, showing dense glandular tissue
with an oval configuration in the 10 o'clock position of the right
breast, 1 cm from the nipple. There are multiple simple cysts within
this tissue. The largest measures 1.5 cm in maximum diameter. No
solid masses or other findings suspicious for malignancy were seen.
IMPRESSION: Dense glandular tissue containing multiple simple cysts in the
lateral right breast, corresponding to the area of palpable concern.
No evidence of malignancy.

RECOMMENDATION:
Bilateral screening mammogram 4 months when due. That will be 1 year
since mammographic evaluation of the left breast.

I have discussed the findings and recommendations with the patient.
If applicable, a reminder letter will be sent to the patient
regarding the next appointment.

BI-RADS CATEGORY  2: Benign.

## 2021-12-02 ENCOUNTER — Other Ambulatory Visit (HOSPITAL_BASED_OUTPATIENT_CLINIC_OR_DEPARTMENT_OTHER): Payer: Self-pay | Admitting: Family

## 2021-12-02 DIAGNOSIS — Z1231 Encounter for screening mammogram for malignant neoplasm of breast: Secondary | ICD-10-CM

## 2021-12-06 ENCOUNTER — Encounter (HOSPITAL_BASED_OUTPATIENT_CLINIC_OR_DEPARTMENT_OTHER): Payer: Self-pay

## 2021-12-06 ENCOUNTER — Other Ambulatory Visit: Payer: Self-pay

## 2021-12-06 ENCOUNTER — Ambulatory Visit (HOSPITAL_BASED_OUTPATIENT_CLINIC_OR_DEPARTMENT_OTHER)
Admission: RE | Admit: 2021-12-06 | Discharge: 2021-12-06 | Disposition: A | Discharge status: Home / Self Care | Payer: 59 | Source: Ambulatory Visit | Attending: Family | Admitting: Family

## 2021-12-06 DIAGNOSIS — Z1231 Encounter for screening mammogram for malignant neoplasm of breast: Secondary | ICD-10-CM | POA: Insufficient documentation

## 2021-12-08 ENCOUNTER — Other Ambulatory Visit: Payer: Self-pay | Admitting: Family

## 2021-12-08 DIAGNOSIS — R928 Other abnormal and inconclusive findings on diagnostic imaging of breast: Secondary | ICD-10-CM

## 2022-01-04 ENCOUNTER — Ambulatory Visit
Admission: RE | Admit: 2022-01-04 | Discharge: 2022-01-04 | Disposition: A | Discharge status: Home / Self Care | Payer: 59 | Source: Ambulatory Visit | Attending: Family | Admitting: Family

## 2022-01-04 DIAGNOSIS — R922 Inconclusive mammogram: Secondary | ICD-10-CM | POA: Diagnosis not present

## 2022-01-04 DIAGNOSIS — R928 Other abnormal and inconclusive findings on diagnostic imaging of breast: Secondary | ICD-10-CM

## 2022-01-04 DIAGNOSIS — N6001 Solitary cyst of right breast: Secondary | ICD-10-CM | POA: Diagnosis not present

## 2022-01-05 ENCOUNTER — Encounter: Payer: Self-pay | Admitting: Family

## 2022-01-06 ENCOUNTER — Other Ambulatory Visit (HOSPITAL_COMMUNITY): Payer: Self-pay

## 2022-01-09 ENCOUNTER — Other Ambulatory Visit (HOSPITAL_COMMUNITY): Payer: Self-pay

## 2022-01-09 ENCOUNTER — Other Ambulatory Visit: Payer: Self-pay

## 2022-01-09 MED ORDER — PSEUDOEPH-BROMPHEN-DM 30-2-10 MG/5ML PO SYRP
ORAL_SOLUTION | ORAL | 0 refills | Status: DC
  Filled 2022-01-09: qty 140, 7d supply, fill #0

## 2022-01-12 ENCOUNTER — Other Ambulatory Visit: Payer: 59

## 2022-03-03 ENCOUNTER — Encounter: Payer: 59 | Admitting: Family

## 2022-03-06 ENCOUNTER — Encounter: Payer: 59 | Admitting: Family

## 2022-03-14 ENCOUNTER — Ambulatory Visit (INDEPENDENT_AMBULATORY_CARE_PROVIDER_SITE_OTHER): Payer: 59 | Admitting: Family

## 2022-03-14 ENCOUNTER — Encounter: Payer: Self-pay | Admitting: Family

## 2022-03-14 VITALS — BP 114/45 | HR 102 | Temp 98.4°F | Resp 16 | Ht 67.0 in | Wt 129.0 lb

## 2022-03-14 DIAGNOSIS — E875 Hyperkalemia: Secondary | ICD-10-CM | POA: Diagnosis not present

## 2022-03-14 DIAGNOSIS — E038 Other specified hypothyroidism: Secondary | ICD-10-CM

## 2022-03-14 DIAGNOSIS — Z01419 Encounter for gynecological examination (general) (routine) without abnormal findings: Secondary | ICD-10-CM | POA: Diagnosis not present

## 2022-03-14 DIAGNOSIS — Z1211 Encounter for screening for malignant neoplasm of colon: Secondary | ICD-10-CM | POA: Diagnosis not present

## 2022-03-14 DIAGNOSIS — Z0000 Encounter for general adult medical examination without abnormal findings: Secondary | ICD-10-CM | POA: Diagnosis not present

## 2022-03-14 DIAGNOSIS — N943 Premenstrual tension syndrome: Secondary | ICD-10-CM | POA: Diagnosis not present

## 2022-03-14 LAB — T4, FREE: Free T4: 0.99 ng/dL (ref 0.60–1.60)

## 2022-03-14 LAB — COMPREHENSIVE METABOLIC PANEL
ALT: 13 U/L (ref 0–35)
AST: 15 U/L (ref 0–37)
Albumin: 4.4 g/dL (ref 3.5–5.2)
Alkaline Phosphatase: 63 U/L (ref 39–117)
BUN: 17 mg/dL (ref 6–23)
CO2: 29 mEq/L (ref 19–32)
Calcium: 9.1 mg/dL (ref 8.4–10.5)
Chloride: 105 mEq/L (ref 96–112)
Creatinine, Ser: 0.71 mg/dL (ref 0.40–1.20)
GFR: 102.22 mL/min (ref >60.00)
Glucose, Bld: 74 mg/dL (ref 70–99)
Potassium: 4.9 mEq/L (ref 3.5–5.1)
Sodium: 139 mEq/L (ref 135–145)
Total Bilirubin: 0.7 mg/dL (ref 0.2–1.2)
Total Protein: 6.9 g/dL (ref 6.0–8.3)

## 2022-03-14 LAB — T3, FREE: T3, Free: 2.6 pg/mL (ref 2.3–4.2)

## 2022-03-14 LAB — TSH: TSH: 4.47 u[IU]/mL (ref 0.35–5.50)

## 2022-03-14 NOTE — Assessment & Plan Note (Signed)
Wt Readings from Last 3 Encounters:  ?03/14/22 129 lb (58.5 kg)  ?03/01/21 124 lb 12.8 oz (56.6 kg)  ?02/12/20 132 lb (59.9 kg)  ? ?Continue healthy diet, exercise. Refer for colo, declines bivalent booster. ?

## 2022-03-14 NOTE — Patient Instructions (Signed)
Please complete lab work prior to leaving.   

## 2022-03-14 NOTE — Progress Notes (Signed)
? ?Subjective:  ? ?By signing my name below, I, Carylon Perches, attest that this documentation has been prepared under the direction and in the presence of Debbrah Alar NP, 03/14/2022  ? ? Patient ID: [name], female    DOB: [birth year], 46 y.o.   MRN: [id number] ? ?Chief Complaint  ?Patient presents with  ? Annual Exam  ?   ?  ? ? ?HPI ?Patient is in today for a comprehensive physical exam. ? ?Depression - She has been taking 50 MG of Zoloft and she states that the medication has been great for her symptoms.  ? ?She denies having any fever, ear pain, new muscle pain, joint pain, new moles, congestion, sinus pain, sore throat, palpations, wheezing, n/v/d, constipation, blood in stool, dysuria, frequency, hematuria, headaches, depresssion or anxiety at this time. ? ?Social History: She reports no new changes to her family history. She denies any recent surgeries.  ?Colonoscopy: She is due for her colonoscopy.  ?Dexa: Last completed on 09/12/2019. ?Pap Smear: Last completed on 03/27/2017. She has an appointment for a pap smear on 03/14/2022 ?Mammogram: Last completed on 01/04/2022 ?Immunizations: She is UTD on her flu and TDAP vaccine. She is not interested in receiving the COVID - 19 bivalent vaccine. She is also not interested in receiving the HepC screening.  ?Diet: She has a healthy diet. ?Exercise: She has been exercising more. She states that she does not have an exercise routine.  ? ?Health Maintenance Due  ?Topic Date Due  ? PAP SMEAR-Modifier  03/27/2020  ? COLONOSCOPY (Pts 45-58yr Insurance coverage will need to be confirmed)  Never done  ? ? ?Past Medical History:  ?Diagnosis Date  ? Hypercalcemia   ? Osteopenia   ? Postpartum care following vaginal delivery (5/19) 04/15/2015  ? Postpartum care following vaginal delivery (7/26) 06/22/2012  ? ? ?Past Surgical History:  ?Procedure Laterality Date  ? LASIK    ? NSVD   07/23/2009  ? WISDOM TOOTH EXTRACTION    ? ? ?Family History  ?Problem Relation Age  of Onset  ? Heart attack Father   ? Basal cell carcinoma Mother   ? Hyperlipidemia Mother   ? Diabetes Maternal Grandmother   ? Kidney disease Maternal Grandmother   ? Cancer Maternal Grandfather   ?     lung  ? Osteoporosis Paternal Grandmother   ? Diabetes Maternal Uncle   ? Kidney disease Maternal Uncle   ?     died at age 462 ? CVA Maternal Uncle   ? CVA Brother   ?     occured in his 315'safter trauma to his neck.    ? CAD Paternal Uncle   ?     died in 559'sfor MI  ? Kidney disease Son   ?     one cystic kidney  ? ? ?Social History  ? ?Socioeconomic History  ? Marital status: Married  ?  Spouse name: Not on file  ? Number of children: Not on file  ? Years of education: Not on file  ? Highest education level: Not on file  ?Occupational History  ? Not on file  ?Tobacco Use  ? Smoking status: Never  ? Smokeless tobacco: Never  ?Vaping Use  ? Vaping Use: Never used  ?Substance and Sexual Activity  ? Alcohol use: No  ? Drug use: No  ? Sexual activity: Yes  ?  Birth control/protection: Surgical  ?  Comment: husband has vasectomy  ?Other Topics Concern  ?  Not on file  ?Social History Narrative  ? Exercise-none  ? Fruits and veggies, 3 meals a day. Minimal snacking.  ? 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 Has an 36, 82 and 56 year old (all sons)  ? Married  ? Enjoys travelling, yard Press photographer, Radio producer, antiques  ? ?Social Determinants of Health  ? ?Financial Resource Strain: Not on file  ?Food Insecurity: Not on file  ?Transportation Needs: Not on file  ?Physical Activity: Not on file  ?Stress: Not on file  ?Social Connections: Not on file  ?Intimate Partner Violence: Not on file  ? ? ?Outpatient Medications Prior to Visit  ?Medication Sig Dispense Refill  ? brompheniramine-pseudoephedrine-DM 30-2-10 MG/5ML syrup Take 62ms by mouth 4 times daily as needed for up to 7 days 140 mL 0  ? cholecalciferol (VITAMIN D) 1000 units tablet Take 1,000 Units by mouth daily.    ? docusate sodium (COLACE) 100 MG capsule Take  100 mg by mouth daily as needed for mild constipation.    ? famotidine (PEPCID) 20 MG tablet Take 20 mg by mouth daily as needed.     ? fluticasone (FLONASE) 50 MCG/ACT nasal spray Place 2 sprays into both nostrils daily for 10 days 16 g 0  ? ibuprofen (ADVIL,MOTRIN) 600 MG tablet Take 1 tablet (600 mg total) by mouth every 6 (six) hours. 30 tablet 0  ? influenza vac split quadrivalent PF (FLUARIX) 0.5 ML injection Inject into the muscle. 0.5 mL 0  ? loratadine (CLARITIN) 10 MG tablet Take 10 mg by mouth daily as needed for allergies.    ? sertraline (ZOLOFT) 50 MG tablet Take 1 tablet (50 mg total) by mouth daily. 90 tablet 1  ? amoxicillin-clavulanate (AUGMENTIN) 875-125 MG tablet Take 1 tablet by mouth 2 (two) times daily. 20 tablet 0  ? amoxicillin-clavulanate (AUGMENTIN) 875-125 MG tablet Take 1 tablet by mouth 2 times daily 20 tablet 0  ? benzonatate (TESSALON) 100 MG capsule Take 1 capsule (100 mg total) by mouth 3 (three) times daily as needed for cough. 30 capsule 0  ? COVID-19 At Home Antigen Test ([facility]) KIT Use as directed within package instructions. 4 each 0  ? COVID-19 At Home Antigen Test ([facility] COVID-19 HOME TEST) KIT Use as directed within package instructions. 4 each 0  ? doxycycline (VIBRA-TABS) 100 MG tablet Take 1 tablet (100 mg total) by mouth 2 (two) times daily. 14 tablet 0  ? fluticasone (FLONASE) 50 MCG/ACT nasal spray Place 1 spray into both nostrils daily. 16 g 5  ? olopatadine (PATANOL) 0.1 % ophthalmic solution Place 1 drop into both eyes 2 times daily for 10 days 5 mL 12  ? fluticasone (FLONASE) 50 MCG/ACT nasal spray Place 2 sprays into both nostrils daily for 10 days. 16 g 0  ? ?No facility-administered medications prior to visit.  ? ? ?Allergies  ?Allergen Reactions  ? Azithromycin Nausea And Vomiting and Other (See Comments)  ?  DIZZINESS  ? Sulfa Antibiotics Rash  ? ? ?Review of Systems  ?Constitutional:  Negative for fever.  ?HENT:  Negative for  congestion, sinus pain and sore throat.   ?Respiratory:  Negative for wheezing.   ?Cardiovascular:  Negative for palpitations.  ?Gastrointestinal:  Negative for blood in stool, constipation, diarrhea, nausea and vomiting.  ?Genitourinary:  Negative for dysuria, frequency and hematuria.  ?Musculoskeletal:  Negative for joint pain and myalgias.  ?Skin:   ?     (-) [place]  ?  Neurological:  Negative for headaches.  ?Psychiatric/Behavioral:  Negative for depression. The patient is not nervous/anxious.   ? ?   ?Objective:  ?  ?Physical Exam ?Constitutional:   ?   General: She is not in acute distress. ?   Appearance: Normal appearance. She is not ill-appearing.  ?HENT:  ?   Head: Normocephalic and atraumatic.  ?   Right Ear: Tympanic membrane, ear canal and external ear normal.  ?   Left Ear: Tympanic membrane, ear canal and external ear normal.  ?Eyes:  ?   Extraocular Movements: Extraocular movements intact.  ?   Pupils: Pupils are equal, round, and reactive to light.  ?Cardiovascular:  ?   Rate and Rhythm: Normal rate and regular rhythm.  ?   Heart sounds: Normal heart sounds. No murmur heard. ?  No gallop.  ?Pulmonary:  ?   Effort: Pulmonary effort is normal. No respiratory distress.  ?   Breath sounds: Normal breath sounds. No wheezing or rales.  ?Abdominal:  ?   General: Bowel sounds are normal. There is no distension.  ?   Palpations: Abdomen is soft.  ?   Tenderness: There is no abdominal tenderness. There is no guarding.  ?Musculoskeletal:  ?   Comments: 5/5 strength in both upper and lower extremities   ?Skin: ?   General: Skin is warm and dry.  ?Neurological:  ?   Mental Status: She is alert and oriented to person, place, and time.  ?   Deep Tendon Reflexes:  ?   Reflex Scores: ?     Patellar reflexes are 2+ on the right side and 2+ on the left side. ?Psychiatric:     ?   Mood and Affect: Mood normal.     ?   Behavior: Behavior normal.     ?   Judgment: Judgment normal.  ? ? ?BP (!) 114/45 (BP Location: Right  Arm, Patient Position: Sitting, Cuff Size: Small)   Pulse (!) 102   Temp 98.4 ?F (36.9 ?C) (Oral)   Resp 16   Ht 5' 7"  (1.702 m)   Wt 129 lb (58.5 kg)   SpO2 100%   BMI 20.20 kg/m?  ?Wt Readings from Last 3

## 2022-04-10 ENCOUNTER — Other Ambulatory Visit (HOSPITAL_COMMUNITY): Payer: Self-pay

## 2022-04-10 MED ORDER — SERTRALINE HCL 25 MG PO TABS
ORAL_TABLET | ORAL | 11 refills | Status: DC
  Filled 2022-04-10 – 2022-05-05 (×2): qty 30, 30d supply, fill #0
  Filled 2022-06-12: qty 30, 30d supply, fill #1
  Filled 2022-07-18: qty 30, 30d supply, fill #2
  Filled 2022-08-20: qty 30, 30d supply, fill #3
  Filled 2022-09-20: qty 30, 30d supply, fill #4
  Filled 2022-10-18: qty 30, 30d supply, fill #5
  Filled 2022-11-18: qty 30, 30d supply, fill #6
  Filled 2022-12-19: qty 30, 30d supply, fill #7
  Filled 2023-01-26: qty 30, 30d supply, fill #0
  Filled 2023-02-26: qty 30, 30d supply, fill #1

## 2022-04-12 ENCOUNTER — Telehealth: Payer: Self-pay | Admitting: Family

## 2022-04-12 NOTE — Telephone Encounter (Signed)
See Mychart.

## 2022-04-19 ENCOUNTER — Other Ambulatory Visit (HOSPITAL_COMMUNITY): Payer: Self-pay

## 2022-05-05 ENCOUNTER — Other Ambulatory Visit (HOSPITAL_COMMUNITY): Payer: Self-pay

## 2022-05-22 ENCOUNTER — Other Ambulatory Visit (HOSPITAL_COMMUNITY): Payer: Self-pay

## 2022-05-22 DIAGNOSIS — L918 Other hypertrophic disorders of the skin: Secondary | ICD-10-CM | POA: Diagnosis not present

## 2022-05-22 DIAGNOSIS — D2230 Melanocytic nevi of unspecified part of face: Secondary | ICD-10-CM | POA: Diagnosis not present

## 2022-05-22 DIAGNOSIS — L639 Alopecia areata, unspecified: Secondary | ICD-10-CM | POA: Diagnosis not present

## 2022-05-22 DIAGNOSIS — L578 Other skin changes due to chronic exposure to nonionizing radiation: Secondary | ICD-10-CM | POA: Diagnosis not present

## 2022-05-22 DIAGNOSIS — D225 Melanocytic nevi of trunk: Secondary | ICD-10-CM | POA: Diagnosis not present

## 2022-05-22 DIAGNOSIS — D2272 Melanocytic nevi of left lower limb, including hip: Secondary | ICD-10-CM | POA: Diagnosis not present

## 2022-05-22 DIAGNOSIS — L821 Other seborrheic keratosis: Secondary | ICD-10-CM | POA: Diagnosis not present

## 2022-05-22 DIAGNOSIS — D224 Melanocytic nevi of scalp and neck: Secondary | ICD-10-CM | POA: Diagnosis not present

## 2022-05-22 DIAGNOSIS — L719 Rosacea, unspecified: Secondary | ICD-10-CM | POA: Diagnosis not present

## 2022-05-22 MED ORDER — HALOBETASOL PROPIONATE 0.05 % EX CREA
TOPICAL_CREAM | CUTANEOUS | 1 refills | Status: DC
  Filled 2022-05-22: qty 100, 30d supply, fill #0

## 2022-05-23 ENCOUNTER — Other Ambulatory Visit (HOSPITAL_COMMUNITY): Payer: Self-pay

## 2022-06-12 ENCOUNTER — Other Ambulatory Visit (HOSPITAL_COMMUNITY): Payer: Self-pay

## 2022-07-19 ENCOUNTER — Other Ambulatory Visit (HOSPITAL_COMMUNITY): Payer: Self-pay

## 2022-08-21 ENCOUNTER — Other Ambulatory Visit (HOSPITAL_BASED_OUTPATIENT_CLINIC_OR_DEPARTMENT_OTHER): Payer: Self-pay

## 2022-09-20 ENCOUNTER — Other Ambulatory Visit (HOSPITAL_COMMUNITY): Payer: Self-pay

## 2022-10-20 ENCOUNTER — Other Ambulatory Visit (HOSPITAL_COMMUNITY): Payer: Self-pay

## 2023-01-26 ENCOUNTER — Other Ambulatory Visit (HOSPITAL_BASED_OUTPATIENT_CLINIC_OR_DEPARTMENT_OTHER): Payer: Self-pay

## 2023-02-15 ENCOUNTER — Telehealth: Payer: Self-pay

## 2023-02-15 NOTE — Telephone Encounter (Signed)
Received message below from Pt:   The last mammogram I had required a f/u ultrasound.  There were cysts found.  The technician performing the [place] suggested I have [place] w/ mammograms since my breasts have many cysts.  Is it possible to get an [place] w/ my mammogram?   Please advise?

## 2023-02-19 ENCOUNTER — Other Ambulatory Visit (HOSPITAL_BASED_OUTPATIENT_CLINIC_OR_DEPARTMENT_OTHER): Payer: Self-pay | Admitting: Family

## 2023-02-19 DIAGNOSIS — Z1231 Encounter for screening mammogram for malignant neoplasm of breast: Secondary | ICD-10-CM

## 2023-02-20 ENCOUNTER — Ambulatory Visit (HOSPITAL_BASED_OUTPATIENT_CLINIC_OR_DEPARTMENT_OTHER)
Admission: RE | Admit: 2023-02-20 | Discharge: 2023-02-20 | Disposition: A | Discharge status: Home / Self Care | Payer: Commercial Managed Care - PPO | Source: Ambulatory Visit | Attending: Family | Admitting: Family

## 2023-02-20 DIAGNOSIS — Z1231 Encounter for screening mammogram for malignant neoplasm of breast: Secondary | ICD-10-CM | POA: Diagnosis not present

## 2023-02-27 ENCOUNTER — Other Ambulatory Visit: Payer: Self-pay

## 2023-03-01 ENCOUNTER — Encounter: Payer: Self-pay | Admitting: Gastroenterology

## 2023-03-16 ENCOUNTER — Encounter: Payer: Self-pay | Admitting: Family

## 2023-03-16 ENCOUNTER — Encounter: Payer: Self-pay | Admitting: Gastroenterology

## 2023-03-16 ENCOUNTER — Ambulatory Visit: Payer: Commercial Managed Care - PPO | Admitting: Family

## 2023-03-16 ENCOUNTER — Other Ambulatory Visit (HOSPITAL_COMMUNITY): Payer: Self-pay

## 2023-03-16 ENCOUNTER — Telehealth: Payer: Self-pay | Admitting: Family

## 2023-03-16 ENCOUNTER — Telehealth: Payer: Self-pay | Admitting: *Deleted

## 2023-03-16 ENCOUNTER — Ambulatory Visit (AMBULATORY_SURGERY_CENTER): Payer: Commercial Managed Care - PPO | Admitting: *Deleted

## 2023-03-16 VITALS — BP 118/60 | HR 88 | Resp 18 | Ht 67.0 in | Wt 133.0 lb

## 2023-03-16 VITALS — Ht 67.0 in | Wt 133.0 lb

## 2023-03-16 DIAGNOSIS — J309 Allergic rhinitis, unspecified: Secondary | ICD-10-CM

## 2023-03-16 DIAGNOSIS — L719 Rosacea, unspecified: Secondary | ICD-10-CM

## 2023-03-16 DIAGNOSIS — M8580 Other specified disorders of bone density and structure, unspecified site: Secondary | ICD-10-CM | POA: Diagnosis not present

## 2023-03-16 DIAGNOSIS — E038 Other specified hypothyroidism: Secondary | ICD-10-CM | POA: Diagnosis not present

## 2023-03-16 DIAGNOSIS — Z0000 Encounter for general adult medical examination without abnormal findings: Secondary | ICD-10-CM | POA: Diagnosis not present

## 2023-03-16 DIAGNOSIS — Z1159 Encounter for screening for other viral diseases: Secondary | ICD-10-CM | POA: Diagnosis not present

## 2023-03-16 DIAGNOSIS — Z13220 Encounter for screening for lipoid disorders: Secondary | ICD-10-CM

## 2023-03-16 DIAGNOSIS — F411 Generalized anxiety disorder: Secondary | ICD-10-CM | POA: Diagnosis not present

## 2023-03-16 DIAGNOSIS — Z1211 Encounter for screening for malignant neoplasm of colon: Secondary | ICD-10-CM

## 2023-03-16 LAB — LIPID PANEL
Cholesterol: 167 mg/dL (ref 0–200)
HDL: 49.3 mg/dL (ref >39.00)
LDL Cholesterol: 103 mg/dL — ABNORMAL HIGH (ref 0–99)
NonHDL: 117.6
Total CHOL/HDL Ratio: 3
Triglycerides: 75 mg/dL (ref 0.0–149.0)
VLDL: 15 mg/dL (ref 0.0–40.0)

## 2023-03-16 LAB — COMPREHENSIVE METABOLIC PANEL
ALT: 12 U/L (ref 0–35)
AST: 16 U/L (ref 0–37)
Albumin: 4.3 g/dL (ref 3.5–5.2)
Alkaline Phosphatase: 57 U/L (ref 39–117)
BUN: 19 mg/dL (ref 6–23)
CO2: 27 mEq/L (ref 19–32)
Calcium: 9.3 mg/dL (ref 8.4–10.5)
Chloride: 105 mEq/L (ref 96–112)
Creatinine, Ser: 0.75 mg/dL (ref 0.40–1.20)
GFR: 95.04 mL/min (ref >60.00)
Glucose, Bld: 83 mg/dL (ref 70–99)
Potassium: 4.6 mEq/L (ref 3.5–5.1)
Sodium: 139 mEq/L (ref 135–145)
Total Bilirubin: 0.7 mg/dL (ref 0.2–1.2)
Total Protein: 6.5 g/dL (ref 6.0–8.3)

## 2023-03-16 LAB — T4, FREE: Free T4: 0.84 ng/dL (ref 0.60–1.60)

## 2023-03-16 LAB — CBC WITH DIFFERENTIAL/PLATELET
Basophils Absolute: 0 10*3/uL (ref 0.0–0.1)
Basophils Relative: 0.4 % (ref 0.0–3.0)
Eosinophils Absolute: 0.1 10*3/uL (ref 0.0–0.7)
Eosinophils Relative: 2.2 % (ref 0.0–5.0)
HCT: 41.2 % (ref 36.0–46.0)
Hemoglobin: 14 g/dL (ref 12.0–15.0)
Lymphocytes Relative: 26.7 % (ref 12.0–46.0)
Lymphs Abs: 1.1 10*3/uL (ref 0.7–4.0)
MCHC: 33.9 g/dL (ref 30.0–36.0)
MCV: 93.7 fl (ref 78.0–100.0)
Monocytes Absolute: 0.3 10*3/uL (ref 0.1–1.0)
Monocytes Relative: 7.4 % (ref 3.0–12.0)
Neutro Abs: 2.7 10*3/uL (ref 1.4–7.7)
Neutrophils Relative %: 63.3 % (ref 43.0–77.0)
Platelets: 195 10*3/uL (ref 150.0–400.0)
RBC: 4.4 Mil/uL (ref 3.87–5.11)
RDW: 13.5 % (ref 11.5–15.5)
WBC: 4.2 10*3/uL (ref 4.0–10.5)

## 2023-03-16 LAB — T3, FREE: T3, Free: 2.8 pg/mL (ref 2.3–4.2)

## 2023-03-16 LAB — TSH: TSH: 2.99 u[IU]/mL (ref 0.35–5.50)

## 2023-03-16 MED ORDER — SERTRALINE HCL 25 MG PO TABS
25.0000 mg | ORAL_TABLET | Freq: Every day | ORAL | 1 refills | Status: DC
  Filled 2023-03-16: qty 90, fill #0
  Filled 2023-03-30 (×2): qty 90, 90d supply, fill #0
  Filled 2023-07-03: qty 90, 90d supply, fill #1

## 2023-03-16 MED ORDER — NA SULFATE-K SULFATE-MG SULF 17.5-3.13-1.6 GM/177ML PO SOLN
1.0000 | Freq: Once | ORAL | 0 refills | Status: AC
  Filled 2023-03-16 – 2023-03-28 (×2): qty 354, 1d supply, fill #0

## 2023-03-16 NOTE — Assessment & Plan Note (Signed)
Noted on dexa back in 04-23-2011.  Repeat Dexa in 2020 was WNL. Continue vit D.  Will confirm that pt is taking calcium as well.

## 2023-03-16 NOTE — Progress Notes (Signed)
Pt's name and DOB verified at the beginning of the pre-visit.  Pt denies any difficulty with ambulating.  No egg or soy allergy known to patient  No issues known to pt with past sedation with any surgeries or procedures Patient denies ever being intubated Pt has no issues moving head neck or swallowing No FH of Malignant Hyperthermia Pt is not on diet pills Pt is not on home 02  Pt is not on blood thinners  Pt has frequent issues with constipation RN instructed pt to use Miralax per bottles instructions a week before prep days. Pt states they will Pt is not on dialysis Pt denies any upcoming cardiac testing Pt encouraged to use to use Singlecare or Goodrx to reduce cost  Patient's chart reviewed by Cathlyn Parsons CNRA prior to pre-visit and patient appropriate for the [facility].  Pre-visit completed and red dot placed by patient's name on their procedure day (on provider's schedule).  . Visit by phone Pt states weight is 133lb Instructions reviewed with pt and pt states understanding. Instructed to review again prior to procedure. Pt states they will.  Instructions sent by mail with coupon and by my chart

## 2023-03-16 NOTE — Assessment & Plan Note (Signed)
Stable on zoloft . Continue same.

## 2023-03-16 NOTE — Telephone Encounter (Signed)
Attempted to reach pt for pre-visit. LM with # for pt to call back. Will attempt the other # listed in profile. Able to reach pt on other #

## 2023-03-16 NOTE — Progress Notes (Signed)
Subjective:     Patient ID: [name], female    DOB: October 20, 1976, 47 y.o.   MRN: [id number]  Chief Complaint  Patient presents with   Annual Exam    HPI Patient is in today for cpx.  Immunizations: up to date except for covid booster Diet: needs to cut back on hre sugars Wt Readings from Last 3 Encounters:  03/16/23 133 lb (60.3 kg)  03/14/22 129 lb (58.5 kg)  03/01/21 124 lb 12.8 oz (56.6 kg)  Exercise: not exercising regulaly except walking the dog Colonoscopy: due- scheduled Pap Smear: 03/14/2022- normal Mammogram: 02/20/2023- normal Vision: due will schedule Dental: up to date   Health Maintenance Due  Topic Date Due   Hepatitis C Screening  Never done   COLONOSCOPY (Pts 45-22yrs Insurance coverage will need to be confirmed)  Never done    Past Medical History:  Diagnosis Date   Hypercalcemia    Osteopenia    Postpartum care following vaginal delivery (5/19) 04/15/2015   Postpartum care following vaginal delivery (7/26) 06/22/2012    Past Surgical History:  Procedure Laterality Date   LASIK     NSVD   07/23/2009   WISDOM TOOTH EXTRACTION      Family History  Problem Relation Age of Onset   Heart attack Father    Basal cell carcinoma Mother    Hyperlipidemia Mother    Diabetes Maternal Grandmother    Kidney disease Maternal Grandmother    Cancer Maternal Grandfather        lung   Osteoporosis Paternal Grandmother    Diabetes Maternal Uncle    Kidney disease Maternal Uncle        died at age 69   CVA Maternal Uncle    CVA Brother        occured in his 51's after trauma to his neck.     CAD Paternal Uncle        died in 66's for MI   Kidney disease Son        one cystic kidney    Social History   Socioeconomic History   Marital status: Married    Spouse name: Not on file   Number of children: Not on file   Years of education: Not on file   Highest education level: Not on file  Occupational History   Not on file  Tobacco Use   Smoking  status: Never   Smokeless tobacco: Never  Vaping Use   Vaping Use: Never used  Substance and Sexual Activity   Alcohol use: No   Drug use: No   Sexual activity: Yes    Birth control/protection: Surgical    Comment: husband has vasectomy  Other Topics Concern   Not on file  Social History Narrative   Exercise-none   Fruits and veggies, 3 meals a day. Minimal snacking.   Works at the [employer] as a Teacher, early years/pre   Has an 70, 27 and 40 year old (all sons)   Married   Enjoys travelling, yard Airline pilot, Armed forces technical officer, Air traffic controller   Social Determinants of Health   Financial Resource Strain: Not on file  Food Insecurity: Not on file  Transportation Needs: Not on file  Physical Activity: Not on file  Stress: Not on file  Social Connections: Not on file  Intimate Partner Violence: Not on file    Outpatient Medications Prior to Visit  Medication Sig Dispense Refill   cholecalciferol (VITAMIN D) 1000 units tablet Take 1,000 Units by mouth daily.  docusate sodium (COLACE) 100 MG capsule Take 100 mg by mouth daily as needed for mild constipation.     famotidine (PEPCID) 20 MG tablet Take 20 mg by mouth daily as needed.      fluticasone (FLONASE) 50 MCG/ACT nasal spray Place 2 sprays into both nostrils daily for 10 days 16 g 0   ibuprofen (ADVIL,MOTRIN) 600 MG tablet Take 1 tablet (600 mg total) by mouth every 6 (six) hours. 30 tablet 0   loratadine (CLARITIN) 10 MG tablet Take 10 mg by mouth daily as needed for allergies.     brompheniramine-pseudoephedrine-DM 30-2-10 MG/5ML syrup Take by mouth 4 times daily as needed for up to 7 days 140 mL 0   halobetasol (ULTRAVATE) 0.05 % cream Apply to affected area topically once a day 30 days 60 g 1   influenza vac split quadrivalent PF (FLUARIX) 0.5 ML injection Inject into the muscle. 0.5 mL 0   sertraline (ZOLOFT) 25 MG tablet Take 1 tablet by mouth daily 30 tablet 11   sertraline (ZOLOFT) 50 MG tablet Take 1 tablet (50 mg total) by mouth daily.  90 tablet 1   No facility-administered medications prior to visit.    Allergies  Allergen Reactions   Azithromycin Nausea And Vomiting and Other (See Comments)    DIZZINESS   Sulfa Antibiotics Rash    Review of Systems  Constitutional:  Negative for weight loss.  HENT:  Negative for congestion and hearing loss.   Eyes:  Negative for blurred vision.       Has dry eye and uses eye drops. Right eye is often tearing- eye doctor aware.   Respiratory:  Negative for cough.   Cardiovascular:  Negative for leg swelling.  Gastrointestinal:  Negative for constipation and diarrhea.  Genitourinary:  Negative for dysuria and frequency.  Musculoskeletal:  Negative for joint pain and myalgias.  Skin:  Negative for rash.  Neurological:  Negative for headaches.  Psychiatric/Behavioral:         Denies depression/anxiety       Objective:    Physical Exam  BP 118/60   Pulse 88   Resp 18   Ht 5\' 7"  (1.702 m)   Wt 133 lb (60.3 kg)   SpO2 100%   BMI 20.83 kg/m  Wt Readings from Last 3 Encounters:  03/16/23 133 lb (60.3 kg)  03/14/22 129 lb (58.5 kg)  03/01/21 124 lb 12.8 oz (56.6 kg)  Physical Exam  Constitutional: She is oriented to person, place, and time. She appears well-developed and well-nourished. No distress.  HENT:  Head: Normocephalic and atraumatic.  Right Ear: Tympanic membrane and ear canal normal.  Left Ear: Tympanic membrane and ear canal normal.  Mouth/Throat: Oropharynx is clear and moist.  Eyes: Pupils are equal, round, and reactive to light. No scleral icterus.  Neck: Normal range of motion. No thyromegaly present.  Cardiovascular: Normal rate and regular rhythm.   No murmur heard. Pulmonary/Chest: Effort normal and breath sounds normal. No respiratory distress. He has no wheezes. She has no rales. She exhibits no tenderness.  Abdominal: Soft. Bowel sounds are normal. She exhibits no distension and no mass. There is no tenderness. There is no rebound and no  guarding.  Musculoskeletal: She exhibits no edema.  Lymphadenopathy:    She has no cervical adenopathy.  Neurological: She is alert and oriented to person, place, and time. She has normal patellar reflexes. She exhibits normal muscle tone. Coordination normal.  Skin: Skin is warm and dry.  Psychiatric: She has a normal mood and affect. Her behavior is normal. Judgment and thought content normal.  Breast/Pelvic: deferred           Assessment & Plan:        Assessment & Plan:   Problem List Items Addressed This Visit       Unprioritized   Subclinical hypothyroidism   Relevant Orders   TSH   T3, free   T4, free   Rosacea    Followed by dermatology for rosacea and skin cancer screening. She is using a compounded cream for her rosacea which is being rx'd by dermatology.       Preventative health care - Primary    Continue healthy diet, exercise (walking). Immunizations reviewed and up to date except for covid booster.  Mammo/pap up to date. Colo pending.       Relevant Orders   Comp Met (CMET)   CBC w/Diff   Lipid panel   Osteopenia    Noted on dexa back in 04-08-11.  Repeat Dexa in 2020 was WNL. Continue vit D.  Will confirm that pt is taking calcium as well.       Anxiety state    Stable on zoloft . Continue same.       Relevant Medications   sertraline (ZOLOFT) 25 MG tablet   Allergic rhinitis    Stable with prn claritin/flonase.       Other Visit Diagnoses     Encounter for hepatitis C screening test for low risk patient       Relevant Orders   Hepatitis C Antibody       I have discontinued Bruna P. Mahan's influenza vac split quadrivalent PF, brompheniramine-pseudoephedrine-DM, and halobetasol. I am also having her maintain her famotidine, ibuprofen, cholecalciferol, loratadine, docusate sodium, fluticasone, and sertraline.  Meds ordered this encounter  Medications   sertraline (ZOLOFT) 25 MG tablet    Sig: Take 1 tablet by mouth daily     Dispense:  90 tablet    Refill:  1    Order Specific Question:   Supervising Provider    Answer:   Danise Edge A [[id number]]

## 2023-03-16 NOTE — Telephone Encounter (Signed)
See mychart.  

## 2023-03-16 NOTE — Assessment & Plan Note (Signed)
Stable with prn claritin/flonase 

## 2023-03-16 NOTE — Assessment & Plan Note (Addendum)
Followed by dermatology for rosacea and skin cancer screening. She is using a compounded cream for her rosacea which is being rx'd by dermatology.

## 2023-03-16 NOTE — Assessment & Plan Note (Signed)
Continue healthy diet, exercise (walking). Immunizations reviewed and up to date except for covid booster.  Mammo/pap up to date. Colo pending.

## 2023-03-17 LAB — HEPATITIS C ANTIBODY: Hepatitis C Ab: NONREACTIVE

## 2023-03-19 DIAGNOSIS — Z01419 Encounter for gynecological examination (general) (routine) without abnormal findings: Secondary | ICD-10-CM | POA: Diagnosis not present

## 2023-03-19 DIAGNOSIS — N943 Premenstrual tension syndrome: Secondary | ICD-10-CM | POA: Diagnosis not present

## 2023-03-23 ENCOUNTER — Other Ambulatory Visit (HOSPITAL_COMMUNITY): Payer: Self-pay

## 2023-03-28 ENCOUNTER — Other Ambulatory Visit (HOSPITAL_COMMUNITY): Payer: Self-pay

## 2023-03-30 ENCOUNTER — Other Ambulatory Visit (HOSPITAL_BASED_OUTPATIENT_CLINIC_OR_DEPARTMENT_OTHER): Payer: Self-pay

## 2023-04-07 ENCOUNTER — Encounter: Payer: Self-pay | Admitting: Certified Registered Nurse Anesthetist

## 2023-04-12 ENCOUNTER — Encounter: Payer: Self-pay | Admitting: Gastroenterology

## 2023-04-12 ENCOUNTER — Ambulatory Visit (AMBULATORY_SURGERY_CENTER): Payer: Commercial Managed Care - PPO | Admitting: Gastroenterology

## 2023-04-12 VITALS — BP 102/60 | HR 84 | Temp 98.7°F | Resp 14 | Ht 67.0 in | Wt 133.0 lb

## 2023-04-12 DIAGNOSIS — Z1211 Encounter for screening for malignant neoplasm of colon: Secondary | ICD-10-CM

## 2023-04-12 MED ORDER — SODIUM CHLORIDE 0.9 % IV SOLN: 500.0000 mL | Freq: Once | INTRAVENOUS | Status: DC

## 2023-04-12 NOTE — Patient Instructions (Addendum)
Resume previous diet.  Continue present medications.  Repeat colonoscopy in 10 years for surveillance.   YOU HAD AN ENDOSCOPIC PROCEDURE TODAY AT THE [facility]:   Refer to the procedure report that was given to you for any specific questions about what was found during the examination.  If the procedure report does not answer your questions, please call your gastroenterologist to clarify.  If you requested that your care partner not be given the details of your procedure findings, then the procedure report has been included in a sealed envelope for you to review at your convenience later.  YOU SHOULD EXPECT: Some feelings of bloating in the abdomen. Passage of more gas than usual.  Walking can help get rid of the air that was put into your GI tract during the procedure and reduce the bloating. If you had a lower endoscopy (such as a colonoscopy or flexible sigmoidoscopy) you may notice spotting of blood in your stool or on the toilet paper. If you underwent a bowel prep for your procedure, you may not have a normal bowel movement for a few days.  Please Note:  You might notice some irritation and congestion in your nose or some drainage.  This is from the oxygen used during your procedure.  There is no need for concern and it should clear up in a day or so.  SYMPTOMS TO REPORT IMMEDIATELY:  Following lower endoscopy (colonoscopy or flexible sigmoidoscopy):  Excessive amounts of blood in the stool  Significant tenderness or worsening of abdominal pains  Swelling of the abdomen that is new, acute  Fever of 100F or higher  For urgent or emergent issues, a gastroenterologist can be reached at any hour by calling [phone]. Do not use MyChart messaging for urgent concerns.    DIET:  We do recommend a small meal at first, but then you may proceed to your regular diet.  Drink plenty of fluids but you should avoid alcoholic beverages for 24 hours.  ACTIVITY:  You should plan to  take it easy for the rest of today and you should NOT DRIVE or use heavy machinery until tomorrow (because of the sedation medicines used during the test).    FOLLOW UP: Our staff will call the number listed on your records the next business day following your procedure.  We will call around 7:15- 8:00 am to check on you and address any questions or concerns that you may have regarding the information given to you following your procedure. If we do not reach you, we will leave a message.     If any biopsies were taken you will be contacted by phone or by letter within the next 1-3 weeks.  Please call us at [phone] if you have not heard about the biopsies in 3 weeks.    SIGNATURES/CONFIDENTIALITY: You and/or your care partner have signed paperwork which will be entered into your electronic medical record.  These signatures attest to the fact that that the information above on your After Visit Summary has been reviewed and is understood.  Full responsibility of the confidentiality of this discharge information lies with you and/or your care-partner. 

## 2023-04-12 NOTE — Progress Notes (Signed)
Report given to PACU, vss 

## 2023-04-12 NOTE — Progress Notes (Signed)
[facility] History and Physical   Primary Care Physician:  Sandford Craze, NP   Reason for Procedure:  Colorectal cancer screening  Plan:    Screening colonoscopy with possible interventions as needed     HPI: [name] is a very pleasant 47 y.o. female here for screening colonoscopy. Denies any nausea, vomiting, abdominal pain, melena or bright red blood per rectum  The risks and benefits as well as alternatives of endoscopic procedure(s) have been discussed and reviewed. All questions answered. The patient agrees to proceed.    Past Medical History:  Diagnosis Date   Hypercalcemia    Kidney stone    Osteopenia    Postpartum care following vaginal delivery (5/19) 04/15/2015   Postpartum care following vaginal delivery (7/26) 06/22/2012    Past Surgical History:  Procedure Laterality Date   LASIK     NSVD   07/23/2009   WISDOM TOOTH EXTRACTION      Prior to Admission medications   Medication Sig Start Date End Date Taking? Authorizing Provider  cholecalciferol (VITAMIN D) 1000 units tablet Take 1,000 Units by mouth daily.   Yes [provider]  docusate sodium (COLACE) 100 MG capsule Take 100 mg by mouth daily as needed for mild constipation.   Yes [provider]  sertraline (ZOLOFT) 25 MG tablet Take 1 tablet (25 mg total) by mouth daily. 03/16/23  Yes Sandford Craze, NP  famotidine (PEPCID) 20 MG tablet Take 20 mg by mouth daily as needed.     [provider]  fluticasone (FLONASE) 50 MCG/ACT nasal spray Place 2 sprays into both nostrils daily for 10 days 10/30/21     ibuprofen (ADVIL,MOTRIN) 600 MG tablet Take 1 tablet (600 mg total) by mouth every 6 (six) hours. 04/17/15   Raelyn Mora, CNM  loratadine (CLARITIN) 10 MG tablet Take 10 mg by mouth daily as needed for allergies.    [provider]    Current Outpatient Medications  Medication Sig Dispense Refill   cholecalciferol (VITAMIN D) 1000 units  tablet Take 1,000 Units by mouth daily.     docusate sodium (COLACE) 100 MG capsule Take 100 mg by mouth daily as needed for mild constipation.     sertraline (ZOLOFT) 25 MG tablet Take 1 tablet (25 mg total) by mouth daily. 90 tablet 1   famotidine (PEPCID) 20 MG tablet Take 20 mg by mouth daily as needed.      fluticasone (FLONASE) 50 MCG/ACT nasal spray Place 2 sprays into both nostrils daily for 10 days 16 g 0   ibuprofen (ADVIL,MOTRIN) 600 MG tablet Take 1 tablet (600 mg total) by mouth every 6 (six) hours. 30 tablet 0   loratadine (CLARITIN) 10 MG tablet Take 10 mg by mouth daily as needed for allergies.     Current Facility-Administered Medications  Medication Dose Route Frequency Provider Last Rate Last Admin   0.9 %  sodium chloride infusion  500 mL Intravenous Once , Eleonore Chiquito, MD        Allergies as of 04/12/2023 - Review Complete 04/12/2023  Allergen Reaction Noted   Azithromycin Nausea And Vomiting and Other (See Comments) 04/11/2011   Sulfa antibiotics Rash 04/11/2011    Family History  Problem Relation Age of Onset   Basal cell carcinoma Mother    Hyperlipidemia Mother    Heart attack Father    CVA Brother        occured in his 63's after trauma to his neck.     Diabetes  Maternal Uncle    Kidney disease Maternal Uncle        died at age 63   CVA Maternal Uncle    CAD Paternal Uncle        died in 7's for MI   Diabetes Maternal Grandmother    Kidney disease Maternal Grandmother    Cancer Maternal Grandfather        lung   Osteoporosis Paternal Grandmother    Kidney disease Son        one cystic kidney   Colon polyps Neg Hx    Colon cancer Neg Hx    Esophageal cancer Neg Hx    Stomach cancer Neg Hx    Rectal cancer Neg Hx     Social History   Socioeconomic History   Marital status: Married    Spouse name: Not on file   Number of children: Not on file   Years of education: Not on file   Highest education level: Not on file  Occupational  History   Not on file  Tobacco Use   Smoking status: Never   Smokeless tobacco: Never  Vaping Use   Vaping Use: Never used  Substance and Sexual Activity   Alcohol use: No   Drug use: No   Sexual activity: Yes    Birth control/protection: Surgical    Comment: husband has vasectomy  Other Topics Concern   Not on file  Social History Narrative   Exercise-none   Fruits and veggies, 3 meals a day. Minimal snacking.   Works at the 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  Physical Exam: Vital signs in last 24 hours: Blood Pressure 129/74 (BP Location: Left Arm, Patient Position: Sitting, Cuff Size: Normal)   Pulse 83   Temperature 98.7 F (37.1 C) (Temporal)   Height 5\' 7"  (1.702 m)   Weight 133 lb (60.3 kg)   Oxygen Saturation 100%   Body Mass Index 20.83 kg/m  General:   Alert, NAD Lungs:  Clear .   Heart:  Regular rate and rhythm Abdomen:  Soft, nontender and nondistended. Neuro/Psych:  Alert and cooperative. Normal mood and affect. A and O x 3  Reviewed labs, radiology imaging, old records and pertinent past GI work up  Patient is appropriate for planned procedure(s) and anesthesia in an ambulatory setting   K. Scherry Ran , MD [phone]

## 2023-04-12 NOTE — Progress Notes (Signed)
Vitals-[name]  Pt's states no medical or surgical changes since previsit or office visit.

## 2023-04-12 NOTE — Op Note (Signed)
[facility] Patient Name: [name] Procedure Date: 04/12/2023 11:06 AM MRN: [id number] Endoscopist: Napoleon Form , MD, [id number] Age: 47 Referring MD:  Date of Birth: 05/06/1976 Gender: Female Account #: [id number] Procedure:                Colonoscopy Indications:              Screening for colorectal malignant neoplasm Medicines:                Monitored Anesthesia Care Procedure:                Pre-Anesthesia Assessment:                           - Prior to the procedure, a History and Physical                            was performed, and patient medications and                            allergies were reviewed. The patient's tolerance of                            previous anesthesia was also reviewed. The risks                            and benefits of the procedure and the sedation                            options and risks were discussed with the patient.                            All questions were answered, and informed consent                            was obtained. Prior Anticoagulants: The patient has                            taken no anticoagulant or antiplatelet agents. ASA                            Grade Assessment: II - A patient with mild systemic                            disease. After reviewing the risks and benefits,                            the patient was deemed in satisfactory condition to                            undergo the procedure.                           After obtaining informed consent, the colonoscope  was passed under direct vision. Throughout the                            procedure, the patient's blood pressure, pulse, and                            oxygen saturations were monitored continuously. The                            [id number]-HQ190L Colonoscope [id number] was introduced                            through the anus and advanced to the the cecum,                            identified by  appendiceal orifice and ileocecal                            valve. The colonoscopy was performed without                            difficulty. The patient tolerated the procedure                            well. The quality of the bowel preparation was                            good. The ileocecal valve, appendiceal orifice, and                            rectum were photographed. Scope In: 11:13:45 AM Scope Out: 11:32:01 AM Scope Withdrawal Time: 0 hours 11 minutes 18 seconds  Total Procedure Duration: 0 hours 18 minutes 16 seconds  Findings:                 The perianal and digital rectal examinations were                            normal.                           A few small-mouthed diverticula were found in the                            sigmoid colon.                           Non-bleeding external and internal hemorrhoids were                            found during retroflexion. The hemorrhoids were                            medium-sized.  The exam was otherwise without abnormality. Complications:            No immediate complications. Estimated Blood Loss:     Estimated blood loss was minimal. Impression:               - Diverticulosis in the sigmoid colon.                           - Non-bleeding external and internal hemorrhoids.                           - The examination was otherwise normal.                           - No specimens collected. Recommendation:           - Patient has a contact number available for                            emergencies. The signs and symptoms of potential                            delayed complications were discussed with the                            patient. Return to normal activities tomorrow.                            Written discharge instructions were provided to the                            patient.                           - Resume previous diet.                           - Continue present  medications.                           - Repeat colonoscopy in 10 years for surveillance. Napoleon Form, MD 04/12/2023 11:41:22 AM This report has been signed electronically.

## 2023-04-13 ENCOUNTER — Telehealth: Payer: Self-pay

## 2023-04-13 NOTE — Telephone Encounter (Signed)
  Follow up Call-     04/12/2023   10:26 AM 04/12/2023   10:18 AM  Call back number  Post procedure Call Back phone  # [phone]   Permission to leave phone message  Yes     Patient questions:  Do you have a fever, pain , or abdominal swelling? No. Pain Score  0 *  Have you tolerated food without any problems? Yes.    Have you been able to return to your normal activities? Yes.    Do you have any questions about your discharge instructions: Diet   No. Medications  No. Follow up visit  No.  Do you have questions or concerns about your Care? No.  Actions: * If pain score is 4 or above: No action needed, pain <4.

## 2023-06-05 DIAGNOSIS — L821 Other seborrheic keratosis: Secondary | ICD-10-CM | POA: Diagnosis not present

## 2023-06-05 DIAGNOSIS — D224 Melanocytic nevi of scalp and neck: Secondary | ICD-10-CM | POA: Diagnosis not present

## 2023-06-05 DIAGNOSIS — D2230 Melanocytic nevi of unspecified part of face: Secondary | ICD-10-CM | POA: Diagnosis not present

## 2023-06-05 DIAGNOSIS — D225 Melanocytic nevi of trunk: Secondary | ICD-10-CM | POA: Diagnosis not present

## 2023-06-05 DIAGNOSIS — L578 Other skin changes due to chronic exposure to nonionizing radiation: Secondary | ICD-10-CM | POA: Diagnosis not present

## 2023-06-05 DIAGNOSIS — L719 Rosacea, unspecified: Secondary | ICD-10-CM | POA: Diagnosis not present

## 2023-06-05 DIAGNOSIS — L639 Alopecia areata, unspecified: Secondary | ICD-10-CM | POA: Diagnosis not present

## 2023-06-05 DIAGNOSIS — D2272 Melanocytic nevi of left lower limb, including hip: Secondary | ICD-10-CM | POA: Diagnosis not present

## 2023-06-22 ENCOUNTER — Other Ambulatory Visit: Payer: Self-pay | Admitting: Oncology

## 2023-06-22 DIAGNOSIS — Z006 Encounter for examination for normal comparison and control in clinical research program: Secondary | ICD-10-CM

## 2023-06-26 ENCOUNTER — Other Ambulatory Visit (HOSPITAL_COMMUNITY)
Admission: RE | Admit: 2023-06-26 | Discharge: 2023-06-26 | Disposition: A | Discharge status: Home / Self Care | Payer: Commercial Managed Care - PPO | Attending: Oncology | Admitting: Oncology

## 2023-06-26 DIAGNOSIS — Z006 Encounter for examination for normal comparison and control in clinical research program: Secondary | ICD-10-CM

## 2023-06-29 ENCOUNTER — Other Ambulatory Visit: Payer: Self-pay | Admitting: Oncology

## 2023-06-29 DIAGNOSIS — Z139 Encounter for screening, unspecified: Secondary | ICD-10-CM

## 2023-07-02 ENCOUNTER — Other Ambulatory Visit (HOSPITAL_COMMUNITY)
Admission: RE | Admit: 2023-07-02 | Discharge: 2023-07-02 | Disposition: A | Discharge status: Home / Self Care | Payer: Self-pay | Source: Other Acute Inpatient Hospital | Attending: Oncology | Admitting: Oncology

## 2023-07-02 DIAGNOSIS — Z139 Encounter for screening, unspecified: Secondary | ICD-10-CM | POA: Insufficient documentation

## 2023-07-05 ENCOUNTER — Encounter (HOSPITAL_COMMUNITY): Payer: Self-pay

## 2023-07-05 ENCOUNTER — Other Ambulatory Visit (HOSPITAL_COMMUNITY): Payer: Self-pay

## 2023-07-05 DIAGNOSIS — H52223 Regular astigmatism, bilateral: Secondary | ICD-10-CM | POA: Diagnosis not present

## 2023-07-05 DIAGNOSIS — H524 Presbyopia: Secondary | ICD-10-CM | POA: Diagnosis not present

## 2023-07-05 DIAGNOSIS — H5213 Myopia, bilateral: Secondary | ICD-10-CM | POA: Diagnosis not present

## 2023-07-05 DIAGNOSIS — H16223 Keratoconjunctivitis sicca, not specified as Sjogren's, bilateral: Secondary | ICD-10-CM | POA: Diagnosis not present

## 2023-07-05 MED ORDER — RESTASIS 0.05 % OP EMUL
OPHTHALMIC | 3 refills | Status: DC
  Filled 2023-07-05 – 2023-10-05 (×3): qty 180, 90d supply, fill #0

## 2023-07-06 ENCOUNTER — Other Ambulatory Visit (HOSPITAL_COMMUNITY): Payer: Self-pay

## 2023-07-06 ENCOUNTER — Other Ambulatory Visit: Payer: Self-pay

## 2023-07-12 ENCOUNTER — Other Ambulatory Visit: Payer: Self-pay

## 2023-07-25 ENCOUNTER — Other Ambulatory Visit: Payer: Self-pay

## 2023-08-21 LAB — HELIX MOLECULAR SCREEN: Genetic Analysis Overall Interpretation: NEGATIVE

## 2023-08-28 ENCOUNTER — Encounter: Payer: Self-pay | Admitting: Family

## 2023-10-05 ENCOUNTER — Other Ambulatory Visit: Payer: Self-pay

## 2023-10-05 ENCOUNTER — Other Ambulatory Visit: Payer: Self-pay | Admitting: Family

## 2023-10-05 MED FILL — Sertraline HCl Tab 25 MG: ORAL | 90 days supply | Qty: 90 | Fill #0 | Status: AC

## 2023-10-05 MED FILL — Sertraline HCl Tab 25 MG: ORAL | 90 days supply | Qty: 90 | Fill #0 | Status: CN

## 2023-12-29 MED FILL — Sertraline HCl Tab 25 MG: ORAL | 90 days supply | Qty: 90 | Fill #1 | Status: AC

## 2024-03-17 ENCOUNTER — Encounter: Payer: Commercial Managed Care - PPO | Admitting: Family

## 2024-03-20 ENCOUNTER — Other Ambulatory Visit: Payer: Self-pay | Admitting: Family

## 2024-03-20 DIAGNOSIS — Z1231 Encounter for screening mammogram for malignant neoplasm of breast: Secondary | ICD-10-CM

## 2024-03-25 DIAGNOSIS — Z01419 Encounter for gynecological examination (general) (routine) without abnormal findings: Secondary | ICD-10-CM | POA: Diagnosis not present

## 2024-03-25 DIAGNOSIS — N943 Premenstrual tension syndrome: Secondary | ICD-10-CM | POA: Diagnosis not present

## 2024-03-26 ENCOUNTER — Other Ambulatory Visit: Payer: Self-pay | Admitting: Family

## 2024-03-26 ENCOUNTER — Other Ambulatory Visit: Payer: Self-pay

## 2024-03-26 MED ORDER — SERTRALINE HCL 25 MG PO TABS
25.0000 mg | ORAL_TABLET | Freq: Every day | ORAL | 1 refills | Status: DC
  Filled 2024-03-26: qty 90, 90d supply, fill #0
  Filled 2024-06-29: qty 90, 90d supply, fill #1

## 2024-03-27 ENCOUNTER — Ambulatory Visit
Admission: RE | Admit: 2024-03-27 | Discharge: 2024-03-27 | Disposition: A | Discharge status: Home / Self Care | Source: Ambulatory Visit | Attending: Family | Admitting: Family

## 2024-03-27 DIAGNOSIS — Z1231 Encounter for screening mammogram for malignant neoplasm of breast: Secondary | ICD-10-CM | POA: Diagnosis not present

## 2024-04-01 ENCOUNTER — Encounter: Payer: Self-pay | Admitting: Family

## 2024-04-01 ENCOUNTER — Ambulatory Visit: Admitting: Family

## 2024-04-01 VITALS — BP 121/43 | HR 88 | Temp 98.8°F | Resp 16 | Ht 67.0 in | Wt 140.0 lb

## 2024-04-01 DIAGNOSIS — Z8639 Personal history of other endocrine, nutritional and metabolic disease: Secondary | ICD-10-CM | POA: Diagnosis not present

## 2024-04-01 DIAGNOSIS — E038 Other specified hypothyroidism: Secondary | ICD-10-CM

## 2024-04-01 DIAGNOSIS — Z8249 Family history of ischemic heart disease and other diseases of the circulatory system: Secondary | ICD-10-CM | POA: Diagnosis not present

## 2024-04-01 DIAGNOSIS — F411 Generalized anxiety disorder: Secondary | ICD-10-CM

## 2024-04-01 DIAGNOSIS — Z0000 Encounter for general adult medical examination without abnormal findings: Secondary | ICD-10-CM

## 2024-04-01 LAB — LIPID PANEL
Cholesterol: 168 mg/dL (ref 0–200)
HDL: 49.6 mg/dL (ref >39.00)
LDL Cholesterol: 105 mg/dL — ABNORMAL HIGH (ref 0–99)
NonHDL: 118.79
Total CHOL/HDL Ratio: 3
Triglycerides: 68 mg/dL (ref 0.0–149.0)
VLDL: 13.6 mg/dL (ref 0.0–40.0)

## 2024-04-01 LAB — COMPREHENSIVE METABOLIC PANEL WITH GFR
ALT: 10 U/L (ref 0–35)
AST: 15 U/L (ref 0–37)
Albumin: 4.3 g/dL (ref 3.5–5.2)
Alkaline Phosphatase: 59 U/L (ref 39–117)
BUN: 17 mg/dL (ref 6–23)
CO2: 30 meq/L (ref 19–32)
Calcium: 9.4 mg/dL (ref 8.4–10.5)
Chloride: 104 meq/L (ref 96–112)
Creatinine, Ser: 0.61 mg/dL (ref 0.40–1.20)
GFR: 105.94 mL/min (ref >60.00)
Glucose, Bld: 98 mg/dL (ref 70–99)
Potassium: 4.4 meq/L (ref 3.5–5.1)
Sodium: 141 meq/L (ref 135–145)
Total Bilirubin: 0.6 mg/dL (ref 0.2–1.2)
Total Protein: 6.4 g/dL (ref 6.0–8.3)

## 2024-04-01 LAB — T3, FREE: T3, Free: 2.9 pg/mL (ref 2.3–4.2)

## 2024-04-01 LAB — TSH: TSH: 2.4 u[IU]/mL (ref 0.35–5.50)

## 2024-04-01 LAB — T4, FREE: Free T4: 0.82 ng/dL (ref 0.60–1.60)

## 2024-04-01 NOTE — Progress Notes (Signed)
 Subjective:     Patient ID: [name], female    DOB: [birth year], 47 y.o.   MRN: [id number]  No chief complaint on file.   HPI  Discussed the use of AI scribe software for clinical note transcription with the patient, who gave verbal consent to proceed.  History of Present Illness  [name] is a 48 year old female who presents for a routine physical exam.  She experiences painful cysts in her breasts, with one on the left and a smaller one on the right, which fluctuate in severity, particularly around her menstrual cycle. A recent mammogram was normal.  A month ago, she developed a hemorrhoid that caused pain and itching, which improved with hydrocortisone and rest. She uses Colace to prevent constipation.  She takes sertraline  25 mg daily for PMS and generalized anxiety, which effectively manages her symptoms. She denies current cough, cold symptoms, chest pain, shortness of breath, swelling, urinary concerns, unusual muscle or joint pain, frequent headaches, or concerns about depression or anxiety. Her hearing and vision are normal. Wt Readings from Last 3 Encounters:  04/01/24 140 lb (63.5 kg)  04/12/23 133 lb (60.3 kg)  03/16/23 133 lb (60.3 kg)        Health Maintenance Due  Topic Date Due   COVID-19 Vaccine (4 - 2024-25 season) 07/29/2023    Past Medical History:  Diagnosis Date   Hypercalcemia    Kidney stone    Osteopenia    Postpartum care following vaginal delivery (5/19) 04/15/2015   Postpartum care following vaginal delivery (7/26) 06/22/2012    Past Surgical History:  Procedure Laterality Date   LASIK     NSVD   07/23/2009   WISDOM TOOTH EXTRACTION      Family History  Problem Relation Age of Onset   Basal cell carcinoma Mother    Hyperlipidemia Mother    Heart attack Father    CVA Brother        occured in his 55's after trauma to his neck.     Diabetes Maternal Grandmother    Kidney disease Maternal Grandmother    Cancer Maternal  Grandfather        lung   Osteoporosis Paternal Grandmother    CAD Paternal Grandfather    Kidney disease Son        one cystic kidney   Diabetes Maternal Uncle    Kidney disease Maternal Uncle        died at age 42   CVA Maternal Uncle    CAD Paternal Uncle        died in 16's for MI   Colon polyps Neg Hx    Colon cancer Neg Hx    Esophageal cancer Neg Hx    Stomach cancer Neg Hx    Rectal cancer Neg Hx     Social History   Socioeconomic History   Marital status: Married    Spouse name: Not on file   Number of children: Not on file   Years of education: Not on file   Highest education level: Not on file  Occupational History   Not on file  Tobacco Use   Smoking status: Never   Smokeless tobacco: Never  Vaping Use   Vaping status: Never Used  Substance and Sexual Activity   Alcohol use: No   Drug use: Yes    Types: Other-see comments   Sexual activity: Yes    Birth control/protection: Surgical    Comment: husband has vasectomy  Other Topics Concern   Not on file  Social History Narrative   Exercise-none   Fruits and veggies, 3 meals a day. Minimal snacking.   Works at the [employer] as a Teacher, early years/pre   Has an 57, 46 and 13 year old (all sons)   Married   Enjoys travelling, yard Airline pilot, Armed forces technical officer, Air traffic controller   Social Drivers of Corporate investment banker Strain: Not on file  Food Insecurity: Not on file  Transportation Needs: Not on file  Physical Activity: Not on file  Stress: Not on file  Social Connections: Not on file  Intimate Partner Violence: Not on file    Outpatient Medications Prior to Visit  Medication Sig Dispense Refill   cholecalciferol (VITAMIN D ) 1000 units tablet Take 1,000 Units by mouth daily.     docusate sodium  (COLACE) 100 MG capsule Take 100 mg by mouth daily as needed for mild constipation.     famotidine (PEPCID) 20 MG tablet Take 20 mg by mouth daily as needed.      fluticasone  (FLONASE ) 50 MCG/ACT nasal spray Place 2 sprays  into both nostrils daily for 10 days 16 g 0   ibuprofen  (ADVIL ,MOTRIN ) 600 MG tablet Take 1 tablet (600 mg total) by mouth every 6 (six) hours. 30 tablet 0   loratadine (CLARITIN) 10 MG tablet Take 10 mg by mouth daily as needed for allergies.     sertraline  (ZOLOFT ) 25 MG tablet Take 1 tablet (25 mg total) by mouth daily. 90 tablet 1   RESTASIS  0.05 % ophthalmic emulsion Instill 1 drop into both eyes twice daily 12 hours apart. 180 each 3   No facility-administered medications prior to visit.    Allergies  Allergen Reactions   Azithromycin Nausea And Vomiting and Other (See Comments)    DIZZINESS   Sulfa Antibiotics Rash    Review of Systems  Constitutional:  Negative for weight loss.  HENT:  Negative for congestion and hearing loss.   Eyes:  Negative for blurred vision.  Respiratory:  Negative for cough and shortness of breath.   Cardiovascular:  Negative for chest pain and leg swelling.  Gastrointestinal:  Negative for constipation and diarrhea.       Has a hemorrhoid  Genitourinary:  Negative for dysuria and frequency.  Musculoskeletal:  Negative for joint pain and myalgias.  Skin:  Negative for rash.  Neurological:  Negative for headaches.  Psychiatric/Behavioral:  Negative for depression. The patient is not nervous/anxious.        Objective:    Physical Exam   BP (!) 121/43 (BP Location: Right Arm, Patient Position: Sitting, Cuff Size: Small)   Pulse 88   Temp 98.8 F (37.1 C) (Oral)   Resp 16   Ht 5\' 7"  (1.702 m)   Wt 140 lb (63.5 kg)   LMP 03/12/2024   SpO2 100%   BMI 21.93 kg/m  Wt Readings from Last 3 Encounters:  04/01/24 140 lb (63.5 kg)  04/12/23 133 lb (60.3 kg)  03/16/23 133 lb (60.3 kg)  Physical Exam  Constitutional: She is oriented to person, place, and time. She appears well-developed and well-nourished. No distress.  HENT:  Head: Normocephalic and atraumatic.  Right Ear: Tympanic membrane and ear canal normal.  Left Ear: Tympanic membrane  and ear canal normal.  Mouth/Throat: Oropharynx is clear and moist.  Eyes: Pupils are equal, round, and reactive to light. No scleral icterus.  Neck: Normal range of motion. No thyromegaly present.  Cardiovascular: Normal rate and regular rhythm.  No murmur heard. Pulmonary/Chest: Effort normal and breath sounds normal. No respiratory distress. He has no wheezes. She has no rales. She exhibits no tenderness.  Abdominal: Soft. Bowel sounds are normal. She exhibits no distension and no mass. There is no tenderness. There is no rebound and no guarding.  Musculoskeletal: She exhibits no edema.  Lymphadenopathy:    She has no cervical adenopathy.  Neurological: She is alert and oriented to person, place, and time.  She exhibits normal muscle tone. Coordination normal.  Skin: Skin is warm and dry.  Psychiatric: She has a normal mood and affect. Her behavior is normal. Judgment and thought content normal.  Breast/pelvic: deferred           Assessment & Plan:        Assessment & Plan:   Problem List Items Addressed This Visit       Unprioritized   Subclinical hypothyroidism   Last TFT's were WNL. Update today.      Preventative health care    Routine wellness visit with up-to-date immunizations. Recent mammogram normal. Colonoscopy scheduled for May 2024. Pap smear current as of April 2023. No new family medical history or surgeries. No symptoms reported. Sertraline  25 mg daily effectively manages PMS-related anxiety. Discussed family history of cardiac issues. - Order thyroid  function tests due to family history of hypothyroidism. - Order lipid panel and metabolic panel. - Schedule follow-up in 6 months.      Relevant Orders   Lipid panel   Comp Met (CMET)   Anxiety state   Reports that sertraline  has helped a lot with PMS symptoms and anxiety. Continue same.      Other Visit Diagnoses       History of hypothyroidism    -  Primary   Relevant Orders   TSH   T3, free    T4, free     Family history of early CAD       Relevant Orders   Lipid panel       I have discontinued Dia P. Kusch's Restasis . I am also having her maintain her famotidine, ibuprofen , cholecalciferol, loratadine, docusate sodium , fluticasone , and sertraline .  No orders of the defined types were placed in this encounter.

## 2024-04-01 NOTE — Patient Instructions (Signed)
 VISIT SUMMARY:  Today, you had a routine physical exam. We discussed your breast cysts, recent hemorrhoid episode, and ongoing management of your anxiety. Your recent mammogram was normal, and your immunizations are up-to-date.  YOUR PLAN:  WELLNESS VISIT: Routine wellness visit with no new symptoms or family medical history updates. Your recent mammogram was normal, and your immunizations are current. -Order thyroid  function tests due to family history of hypothyroidism. -Order lipid panel and metabolic panel. -Your next colonoscopy is scheduled for May 2024. -Your Pap smear is current as of April 2023. -Schedule a follow-up visit in 6 months.  HYPOTHYROIDISM: You have a family history of hypothyroidism, but your thyroid  levels are currently normal and you have no symptoms. -Order thyroid  function tests to monitor your thyroid  levels.  GENERALIZED ANXIETY DISORDER: Your anxiety related to PMS is well-managed with sertraline  25 mg daily. -Continue taking sertraline  25 mg daily. -Refill your sertraline  prescription as needed.  HEMORRHOIDS: You had a recent episode of hemorrhoids that has resolved. There is a visible external hemorrhoid, and you have a history of internal hemorrhoids. -Use hydrocortisone cream as needed for symptom relief. -Continue using Colace to prevent constipation.

## 2024-04-01 NOTE — Assessment & Plan Note (Addendum)
 Reports that sertraline  has helped a lot with PMS symptoms and anxiety. Continue same.

## 2024-04-01 NOTE — Assessment & Plan Note (Signed)
 Last TFT's were WNL. Update today.

## 2024-04-01 NOTE — Assessment & Plan Note (Signed)
  Routine wellness visit with up-to-date immunizations. Recent mammogram normal. Colonoscopy scheduled for May 2024. Pap smear current as of April 2023. No new family medical history or surgeries. No symptoms reported. Sertraline  25 mg daily effectively manages PMS-related anxiety. Discussed family history of cardiac issues. - Order thyroid  function tests due to family history of hypothyroidism. - Order lipid panel and metabolic panel. - Schedule follow-up in 6 months.

## 2024-04-02 ENCOUNTER — Encounter: Payer: Self-pay | Admitting: Family

## 2024-06-24 DIAGNOSIS — L821 Other seborrheic keratosis: Secondary | ICD-10-CM | POA: Diagnosis not present

## 2024-06-24 DIAGNOSIS — L639 Alopecia areata, unspecified: Secondary | ICD-10-CM | POA: Diagnosis not present

## 2024-06-24 DIAGNOSIS — D2272 Melanocytic nevi of left lower limb, including hip: Secondary | ICD-10-CM | POA: Diagnosis not present

## 2024-06-24 DIAGNOSIS — L719 Rosacea, unspecified: Secondary | ICD-10-CM | POA: Diagnosis not present

## 2024-06-24 DIAGNOSIS — D224 Melanocytic nevi of scalp and neck: Secondary | ICD-10-CM | POA: Diagnosis not present

## 2024-06-24 DIAGNOSIS — L578 Other skin changes due to chronic exposure to nonionizing radiation: Secondary | ICD-10-CM | POA: Diagnosis not present

## 2024-06-24 DIAGNOSIS — D2230 Melanocytic nevi of unspecified part of face: Secondary | ICD-10-CM | POA: Diagnosis not present

## 2024-06-24 DIAGNOSIS — D225 Melanocytic nevi of trunk: Secondary | ICD-10-CM | POA: Diagnosis not present

## 2024-06-30 ENCOUNTER — Encounter: Payer: Self-pay | Admitting: Pharmacist

## 2024-06-30 ENCOUNTER — Other Ambulatory Visit: Payer: Self-pay

## 2024-06-30 ENCOUNTER — Other Ambulatory Visit (HOSPITAL_COMMUNITY): Payer: Self-pay

## 2024-08-07 DIAGNOSIS — S62654A Nondisplaced fracture of medial phalanx of right ring finger, initial encounter for closed fracture: Secondary | ICD-10-CM | POA: Diagnosis not present

## 2024-08-07 DIAGNOSIS — S6991XA Unspecified injury of right wrist, hand and finger(s), initial encounter: Secondary | ICD-10-CM | POA: Diagnosis not present

## 2024-08-07 DIAGNOSIS — M25641 Stiffness of right hand, not elsewhere classified: Secondary | ICD-10-CM | POA: Diagnosis not present

## 2024-08-12 DIAGNOSIS — S62644A Nondisplaced fracture of proximal phalanx of right ring finger, initial encounter for closed fracture: Secondary | ICD-10-CM | POA: Diagnosis not present

## 2024-08-22 DIAGNOSIS — S62644A Nondisplaced fracture of proximal phalanx of right ring finger, initial encounter for closed fracture: Secondary | ICD-10-CM | POA: Diagnosis not present

## 2024-09-02 DIAGNOSIS — S6991XA Unspecified injury of right wrist, hand and finger(s), initial encounter: Secondary | ICD-10-CM | POA: Diagnosis not present

## 2024-09-02 DIAGNOSIS — S62644D Nondisplaced fracture of proximal phalanx of right ring finger, subsequent encounter for fracture with routine healing: Secondary | ICD-10-CM | POA: Diagnosis not present

## 2024-09-02 DIAGNOSIS — S62644A Nondisplaced fracture of proximal phalanx of right ring finger, initial encounter for closed fracture: Secondary | ICD-10-CM | POA: Diagnosis not present

## 2024-09-02 DIAGNOSIS — S62654D Nondisplaced fracture of medial phalanx of right ring finger, subsequent encounter for fracture with routine healing: Secondary | ICD-10-CM | POA: Diagnosis not present

## 2024-09-04 DIAGNOSIS — M79644 Pain in right finger(s): Secondary | ICD-10-CM | POA: Diagnosis not present

## 2024-09-09 ENCOUNTER — Encounter: Payer: Self-pay | Admitting: Family

## 2024-09-11 ENCOUNTER — Other Ambulatory Visit (HOSPITAL_BASED_OUTPATIENT_CLINIC_OR_DEPARTMENT_OTHER): Payer: Self-pay

## 2024-09-11 ENCOUNTER — Other Ambulatory Visit: Payer: Self-pay

## 2024-09-11 ENCOUNTER — Telehealth: Admitting: Emergency Medicine

## 2024-09-11 DIAGNOSIS — B9689 Other specified bacterial agents as the cause of diseases classified elsewhere: Secondary | ICD-10-CM | POA: Diagnosis not present

## 2024-09-11 DIAGNOSIS — J019 Acute sinusitis, unspecified: Secondary | ICD-10-CM | POA: Diagnosis not present

## 2024-09-11 MED ORDER — BENZONATATE 100 MG PO CAPS
100.0000 mg | ORAL_CAPSULE | Freq: Two times a day (BID) | ORAL | 0 refills | Status: DC | PRN
  Filled 2024-09-11: qty 20, 10d supply, fill #0

## 2024-09-11 MED ORDER — PSEUDOEPH-BROMPHEN-DM 30-2-10 MG/5ML PO SYRP
5.0000 mL | ORAL_SOLUTION | Freq: Four times a day (QID) | ORAL | 0 refills | Status: DC | PRN
  Filled 2024-09-11 – 2024-09-15 (×2): qty 120, 6d supply, fill #0

## 2024-09-11 MED ORDER — AMOXICILLIN-POT CLAVULANATE 875-125 MG PO TABS
1.0000 | ORAL_TABLET | Freq: Two times a day (BID) | ORAL | 0 refills | Status: DC
  Filled 2024-09-11: qty 14, 7d supply, fill #0

## 2024-09-11 NOTE — Patient Instructions (Signed)
 [name], thank you for joining Jon CHRISTELLA Belt, NP for today's virtual visit.  While this provider is not your primary care provider (PCP), if your PCP is located in our provider database this encounter information will be shared with them immediately following your visit.   A [facility] MyChart account gives you access to today's visit and all your visits, tests, and labs performed at [facility]  click here if you don't have a [facility] MyChart account or go to [email or web address]  Consent: (Patient) [name] provided verbal consent for this virtual visit at the beginning of the encounter.  Current Medications:  Current Outpatient Medications:    amoxicillin -clavulanate (AUGMENTIN ) 875-125 MG tablet, Take 1 tablet by mouth 2 (two) times daily., Disp: 14 tablet, Rfl: 0   benzonatate  (TESSALON ) 100 MG capsule, Take 1 capsule (100 mg total) by mouth 2 (two) times daily as needed for cough., Disp: 20 capsule, Rfl: 0   brompheniramine-pseudoephedrine-DM 30-2-10 MG/5ML syrup, Take 5 mLs by mouth 4 (four) times daily as needed., Disp: 120 mL, Rfl: 0   cholecalciferol (VITAMIN D ) 1000 units tablet, Take 1,000 Units by mouth daily., Disp: , Rfl:    docusate sodium  (COLACE) 100 MG capsule, Take 100 mg by mouth daily as needed for mild constipation., Disp: , Rfl:    famotidine (PEPCID) 20 MG tablet, Take 20 mg by mouth daily as needed. , Disp: , Rfl:    fluticasone  (FLONASE ) 50 MCG/ACT nasal spray, Place 2 sprays into both nostrils daily for 10 days, Disp: 16 g, Rfl: 0   ibuprofen  (ADVIL ,MOTRIN ) 600 MG tablet, Take 1 tablet (600 mg total) by mouth every 6 (six) hours., Disp: 30 tablet, Rfl: 0   loratadine (CLARITIN) 10 MG tablet, Take 10 mg by mouth daily as needed for allergies., Disp: , Rfl:    sertraline  (ZOLOFT ) 25 MG tablet, Take 1 tablet (25 mg total) by mouth daily., Disp: 90 tablet, Rfl: 1   Medications ordered in this encounter:  Meds ordered this encounter   Medications   brompheniramine-pseudoephedrine-DM 30-2-10 MG/5ML syrup    Sig: Take 5 mLs by mouth 4 (four) times daily as needed.    Dispense:  120 mL    Refill:  0   amoxicillin -clavulanate (AUGMENTIN ) 875-125 MG tablet    Sig: Take 1 tablet by mouth 2 (two) times daily.    Dispense:  14 tablet    Refill:  0   benzonatate  (TESSALON ) 100 MG capsule    Sig: Take 1 capsule (100 mg total) by mouth 2 (two) times daily as needed for cough.    Dispense:  20 capsule    Refill:  0     *If you need refills on other medications prior to your next appointment, please contact your pharmacy*  Follow-Up: Call back or seek an in-person evaluation if the symptoms worsen or if the condition fails to improve as anticipated.  [facility] [phone]  Other Instructions Try using saline irrigation, such as with a neti pot, several times a day while you are sick. Many neti pots come with salt packets premeasured to use to make saline. If you use your own salt, make sure it is kosher salt or sea salt (don't use table salt as it has iodine in it and you don't need that in your nose). Use distilled water to make saline. If you mix your own saline using your own salt, the recipe is 1/4 teaspoon salt in 1 cup warm water.  Using saline irrigation can help prevent and treat sinus infections.  Bromphed has dextromethorphan and sudafed in it, be careful not to take other medicines with same ingredients at the same time.   Don't take benzonatate  (tessalon  perles) at same time as bromphed or other cough medicine   If you have been instructed to have an in-person evaluation today at a local Urgent Care facility, please use the link below. It will take you to a list of all of our available [facility], including address, phone number and hours of operation. Please do not delay care.  [facility]  If you or a family member do not have a primary care provider, use the link  below to schedule a visit and establish care. When you choose a [facility] primary care physician or advanced practice provider, you gain a long-term partner in health. Find a Primary Care Provider  Learn more about [facility] in-office and virtual care options:  - Get Care Now

## 2024-09-11 NOTE — Progress Notes (Signed)
 Virtual Visit Consent   [name], you are scheduled for a virtual visit with a [facility] provider today. Just as with appointments in the office, your consent must be obtained to participate. Your consent will be active for this visit and any virtual visit you may have with one of our providers in the next 365 days. If you have a MyChart account, a copy of this consent can be sent to you electronically.  As this is a virtual visit, video technology does not allow for your provider to perform a traditional examination. This may limit your provider's ability to fully assess your condition. If your provider identifies any concerns that need to be evaluated in person or the need to arrange testing (such as labs, EKG, etc.), we will make arrangements to do so. Although advances in technology are sophisticated, we cannot ensure that it will always work on either your end or our end. If the connection with a video visit is poor, the visit may have to be switched to a telephone visit. With either a video or telephone visit, we are not always able to ensure that we have a secure connection.  By engaging in this virtual visit, you consent to the provision of healthcare and authorize for your insurance to be billed (if applicable) for the services provided during this visit. Depending on your insurance coverage, you may receive a charge related to this service.  I need to obtain your verbal consent now. Are you willing to proceed with your visit today? [name] has provided verbal consent on 09/11/2024 for a virtual visit (video or telephone). Jon CHRISTELLA Belt, NP  Date: 09/11/2024 11:29 AM   Virtual Visit via Video Note   I, Jon CHRISTELLA Belt, connected with  [name]  ([id number], [birth year]) on 09/11/24 at 11:15 AM EDT by a video-enabled telemedicine application and verified that I am speaking with the correct person using two identifiers.  Location: Patient: Virtual Visit Location Patient:  Other: parked car in [place] Provider: Virtual Visit Location Provider: Home Office   I discussed the limitations of evaluation and management by telemedicine and the availability of in person appointments. The patient expressed understanding and agreed to proceed.    History of Present Illness: [name] is a 49 y.o. who identifies as a female who was assigned female at birth, and is being seen today for being sick for 12 days.  Started with dry scratchy throat, moved to sinuses, now has B max sinus pressure, doesn't feel congested, nothing is draining from nose but is having post nasal drainage.   No fever, no myalgias.   Has tried udafed, mucinex, flonase  prn, had some relief from old bromphed rx. Dry coughing a lot at night, not sleeping well. Not SOB or congested lungs.    HPI: HPI  Problems:  Patient Active Problem List   Diagnosis Date Noted   Preventative health care 03/14/2022   Subclinical hypothyroidism 10/18/2018   Anxiety state 12/11/2013   Rosacea 04/11/2011   HYPERCALCEMIA 03/01/2007   Allergic rhinitis 03/01/2007   Osteopenia 03/01/2007    Allergies:  Allergies  Allergen Reactions   Azithromycin Nausea And Vomiting and Other (See Comments)    DIZZINESS   Sulfa Antibiotics Rash   Medications:  Current Outpatient Medications:    amoxicillin -clavulanate (AUGMENTIN ) 875-125 MG tablet, Take 1 tablet by mouth 2 (two) times daily., Disp: 14 tablet, Rfl: 0   benzonatate  (TESSALON ) 100 MG capsule, Take 1 capsule (100 mg  total) by mouth 2 (two) times daily as needed for cough., Disp: 20 capsule, Rfl: 0   brompheniramine-pseudoephedrine-DM 30-2-10 MG/5ML syrup, Take 5 mLs by mouth 4 (four) times daily as needed., Disp: 120 mL, Rfl: 0   cholecalciferol (VITAMIN D ) 1000 units tablet, Take 1,000 Units by mouth daily., Disp: , Rfl:    docusate sodium  (COLACE) 100 MG capsule, Take 100 mg by mouth daily as needed for mild constipation., Disp: , Rfl:    famotidine (PEPCID) 20 MG  tablet, Take 20 mg by mouth daily as needed. , Disp: , Rfl:    fluticasone  (FLONASE ) 50 MCG/ACT nasal spray, Place 2 sprays into both nostrils daily for 10 days, Disp: 16 g, Rfl: 0   ibuprofen  (ADVIL ,MOTRIN ) 600 MG tablet, Take 1 tablet (600 mg total) by mouth every 6 (six) hours., Disp: 30 tablet, Rfl: 0   loratadine (CLARITIN) 10 MG tablet, Take 10 mg by mouth daily as needed for allergies., Disp: , Rfl:    sertraline  (ZOLOFT ) 25 MG tablet, Take 1 tablet (25 mg total) by mouth daily., Disp: 90 tablet, Rfl: 1  Observations/Objective: Patient is well-developed, well-nourished in no acute distress.  Resting comfortably in parked car in [place].  Head is normocephalic, atraumatic.  No labored breathing. Does sound congested on video Speech is clear and coherent with logical content.  Patient is alert and oriented at baseline.    Assessment and Plan: 1. Acute bacterial sinusitis (Primary) - brompheniramine-pseudoephedrine-DM 30-2-10 MG/5ML syrup; Take 5 mLs by mouth 4 (four) times daily as needed.  Dispense: 120 mL; Refill: 0 - amoxicillin -clavulanate (AUGMENTIN ) 875-125 MG tablet; Take 1 tablet by mouth 2 (two) times daily.  Dispense: 14 tablet; Refill: 0  Also rx tessalon  perles - discussed not to take them together with bromphed  Follow Up Instructions: I discussed the assessment and treatment plan with the patient. The patient was provided an opportunity to ask questions and all were answered. The patient agreed with the plan and demonstrated an understanding of the instructions.  A copy of instructions were sent to the patient via MyChart unless otherwise noted below.   The patient was advised to call back or seek an in-person evaluation if the symptoms worsen or if the condition fails to improve as anticipated.    Jon CHRISTELLA Belt, NP

## 2024-09-15 ENCOUNTER — Other Ambulatory Visit (HOSPITAL_BASED_OUTPATIENT_CLINIC_OR_DEPARTMENT_OTHER): Payer: Self-pay

## 2024-09-15 ENCOUNTER — Other Ambulatory Visit (HOSPITAL_COMMUNITY): Payer: Self-pay

## 2024-09-18 DIAGNOSIS — S62654D Nondisplaced fracture of medial phalanx of right ring finger, subsequent encounter for fracture with routine healing: Secondary | ICD-10-CM | POA: Diagnosis not present

## 2024-09-18 DIAGNOSIS — S62644D Nondisplaced fracture of proximal phalanx of right ring finger, subsequent encounter for fracture with routine healing: Secondary | ICD-10-CM | POA: Diagnosis not present

## 2024-09-18 DIAGNOSIS — S6991XD Unspecified injury of right wrist, hand and finger(s), subsequent encounter: Secondary | ICD-10-CM | POA: Diagnosis not present

## 2024-09-19 DIAGNOSIS — M79644 Pain in right finger(s): Secondary | ICD-10-CM | POA: Diagnosis not present

## 2024-09-25 ENCOUNTER — Other Ambulatory Visit: Payer: Self-pay | Admitting: Family

## 2024-09-26 ENCOUNTER — Other Ambulatory Visit (HOSPITAL_COMMUNITY): Payer: Self-pay

## 2024-09-26 MED ORDER — SERTRALINE HCL 25 MG PO TABS
25.0000 mg | ORAL_TABLET | Freq: Every day | ORAL | 1 refills | Status: AC
  Filled 2024-09-26: qty 90, 90d supply, fill #0
  Filled 2024-12-22: qty 90, 90d supply, fill #1

## 2024-10-03 ENCOUNTER — Ambulatory Visit: Admitting: Family

## 2024-10-21 ENCOUNTER — Encounter: Payer: Self-pay | Admitting: Family

## 2024-10-21 ENCOUNTER — Ambulatory Visit: Admitting: Family

## 2024-10-21 VITALS — BP 124/64 | HR 96 | Temp 98.8°F | Resp 16 | Ht 67.0 in | Wt 142.4 lb

## 2024-10-21 DIAGNOSIS — K219 Gastro-esophageal reflux disease without esophagitis: Secondary | ICD-10-CM | POA: Insufficient documentation

## 2024-10-21 DIAGNOSIS — F411 Generalized anxiety disorder: Secondary | ICD-10-CM | POA: Diagnosis not present

## 2024-10-21 DIAGNOSIS — E038 Other specified hypothyroidism: Secondary | ICD-10-CM | POA: Diagnosis not present

## 2024-10-21 DIAGNOSIS — J309 Allergic rhinitis, unspecified: Secondary | ICD-10-CM | POA: Diagnosis not present

## 2024-10-21 MED ORDER — FAMOTIDINE 10 MG PO TABS: 10.0000 mg | ORAL_TABLET | Freq: Two times a day (BID) | ORAL | Status: AC | PRN

## 2024-10-21 NOTE — Progress Notes (Signed)
 Subjective:     Patient ID: [name], female    DOB: Nov 27, 1976, 48 y.o.   MRN: [id number]  Chief Complaint  Patient presents with   Follow-up    Patient is here for a follow-up. States that everything is fine    HPI  Discussed the use of AI scribe software for clinical note transcription with the patient, who gave verbal consent to proceed.  History of Present Illness [name] is a 48 year old female who presents for a routine follow-up visit.  Her recent sinusitis, treated with Augmentin  and Tessalon , has resolved. The infection was persistent and caused prolonged discomfort. She rarely needs to use Flonase  and Claritin, except during her recent sinus infection.  In September, she sustained a fracture to her right ring finger after being caught in a dog leash. She was evaluated by a hand specialist at [facility] for the fracture, and she still experiences some discomfort, which she thinks will take a little while to fully resolve.  She is currently taking sertraline  for anxiety and PMS, doing well on a small dose without any side effects. She recently refilled her prescription.  She uses Pepcid  very rarely and obtains it over the counter. She also received her flu shot and has completed the Hepatitis B vaccine series.      Health Maintenance Due  Topic Date Due   COVID-19 Vaccine (4 - 2025-26 season) 07/28/2024    Past Medical History:  Diagnosis Date   Hypercalcemia    Kidney stone    Osteopenia    Postpartum care following vaginal delivery (5/19) 04/15/2015   Postpartum care following vaginal delivery (7/26) 06/22/2012    Past Surgical History:  Procedure Laterality Date   LASIK     NSVD   07/23/2009   WISDOM TOOTH EXTRACTION      Family History  Problem Relation Age of Onset   Basal cell carcinoma Mother    Hyperlipidemia Mother    Heart attack Father    CVA Brother        occured in his 34's after trauma to his neck.     Diabetes Maternal  Grandmother    Kidney disease Maternal Grandmother    Cancer Maternal Grandfather        lung   Osteoporosis Paternal Grandmother    CAD Paternal Grandfather    Kidney disease Son        one cystic kidney   Diabetes Maternal Uncle    Kidney disease Maternal Uncle        died at age 58   CVA Maternal Uncle    CAD Paternal Uncle        died in 48's for MI   Colon polyps Neg Hx    Colon cancer Neg Hx    Esophageal cancer Neg Hx    Stomach cancer Neg Hx    Rectal cancer Neg Hx     Social History   Socioeconomic History   Marital status: Married    Spouse name: Not on file   Number of children: Not on file   Years of education: Not on file   Highest education level: Doctorate  Occupational History   Not on file  Tobacco Use   Smoking status: Never   Smokeless tobacco: Never  Vaping Use   Vaping status: Never Used  Substance and Sexual Activity   Alcohol use: No   Drug use: Yes    Types: Other-see comments   Sexual activity:  Yes    Birth control/protection: Surgical    Comment: husband has vasectomy  Other Topics Concern   Not on file  Social History Narrative   Exercise-none   Fruits and veggies, 3 meals a day. Minimal snacking.   Works at the [employer] as a teacher, early years/pre   Has an 26, 81 and 39 year old (all sons)   Married   Enjoys travelling, yard airline pilot, armed forces technical officer, antiques   Social Drivers of Health   Financial Resource Strain: Low Risk  (10/20/2024)   Overall Financial Resource Strain (CARDIA)    Difficulty of Paying Living Expenses: Not hard at all  Food Insecurity: No Food Insecurity (10/20/2024)   Hunger Vital Sign    Worried About Running Out of Food in the Last Year: Never true    Ran Out of Food in the Last Year: Never true  Transportation Needs: No Transportation Needs (10/20/2024)   PRAPARE - Administrator, Civil Service (Medical): No    Lack of Transportation (Non-Medical): No  Physical Activity: Sufficiently Active (10/20/2024)    Exercise Vital Sign    Days of Exercise per Week: 5 days    Minutes of Exercise per Session: 30 min  Stress: No Stress Concern Present (10/20/2024)   [employer] of Occupational Health - Occupational Stress Questionnaire    Feeling of Stress: Not at all  Social Connections: Socially Integrated (10/20/2024)   Social Connection and Isolation Panel    Frequency of Communication with Friends and Family: More than three times a week    Frequency of Social Gatherings with Friends and Family: More than three times a week    Attends Religious Services: More than 4 times per year    Active Member of [employer] or Organizations: Yes    Attends Engineer, Structural: More than 4 times per year    Marital Status: Married  Catering Manager Violence: Not on file    Outpatient Medications Prior to Visit  Medication Sig Dispense Refill   cholecalciferol (VITAMIN D ) 1000 units tablet Take 1,000 Units by mouth daily.     docusate sodium  (COLACE) 100 MG capsule Take 100 mg by mouth daily as needed for mild constipation.     fluticasone  (FLONASE ) 50 MCG/ACT nasal spray Place 2 sprays into both nostrils daily for 10 days 16 g 0   ibuprofen  (ADVIL ,MOTRIN ) 600 MG tablet Take 1 tablet (600 mg total) by mouth every 6 (six) hours. 30 tablet 0   loratadine (CLARITIN) 10 MG tablet Take 10 mg by mouth daily as needed for allergies.     sertraline  (ZOLOFT ) 25 MG tablet Take 1 tablet (25 mg total) by mouth daily. 90 tablet 1   benzonatate  (TESSALON ) 100 MG capsule Take 1 capsule (100 mg total) by mouth 2 (two) times daily as needed for cough. 20 capsule 0   famotidine  (PEPCID ) 20 MG tablet Take 20 mg by mouth daily as needed.      amoxicillin -clavulanate (AUGMENTIN ) 875-125 MG tablet Take 1 tablet by mouth 2 (two) times daily. 14 tablet 0   brompheniramine-pseudoephedrine-DM 30-2-10 MG/5ML syrup Take 5 mLs by mouth 4 (four) times daily as needed. 120 mL 0   No facility-administered medications prior to  visit.    Allergies  Allergen Reactions   Azithromycin Nausea And Vomiting and Other (See Comments)    DIZZINESS   Sulfa Antibiotics Rash    ROS    See HPI Objective:    Physical Exam Constitutional:  General: She is not in acute distress.    Appearance: Normal appearance. She is well-developed.  HENT:     Head: Normocephalic and atraumatic.     Right Ear: External ear normal.     Left Ear: External ear normal.  Eyes:     General: No scleral icterus. Neck:     Thyroid : No thyromegaly.  Cardiovascular:     Rate and Rhythm: Normal rate and regular rhythm.     Heart sounds: Normal heart sounds. No murmur heard. Pulmonary:     Effort: Pulmonary effort is normal. No respiratory distress.     Breath sounds: Normal breath sounds. No wheezing.  Musculoskeletal:     Cervical back: Neck supple.  Skin:    General: Skin is warm and dry.  Neurological:     Mental Status: She is alert and oriented to person, place, and time.  Psychiatric:        Mood and Affect: Mood normal.        Behavior: Behavior normal.        Thought Content: Thought content normal.        Judgment: Judgment normal.      BP 124/64 (BP Location: Right Arm, Patient Position: Sitting, Cuff Size: Normal)   Pulse 96   Temp 98.8 F (37.1 C) (Oral)   Resp 16   Ht 5' 7 (1.702 m)   Wt 142 lb 6.4 oz (64.6 kg)   SpO2 100%   BMI 22.30 kg/m  Wt Readings from Last 3 Encounters:  10/21/24 142 lb 6.4 oz (64.6 kg)  04/01/24 140 lb (63.5 kg)  04/12/23 133 lb (60.3 kg)       Assessment & Plan:   Problem List Items Addressed This Visit       Unprioritized   Subclinical hypothyroidism   Lab Results  Component Value Date   TSH 2.40 04/01/2024   TFT's have been stable over the last 2 years.      Gastroesophageal reflux disease   Stable with rare need for otc pepcid .        Relevant Medications   famotidine  (PEPCID ) 10 MG tablet   Anxiety state - Primary   Reports PMS symptoms and anxiety  stable/improved on sertraline .      Allergic rhinitis   Rare need for claritin and flonase .        I have discontinued Jodye P. Ouch's famotidine , brompheniramine-pseudoephedrine-DM, amoxicillin -clavulanate, and benzonatate . I am also having her start on famotidine . Additionally, I am having her maintain her ibuprofen , cholecalciferol, loratadine, docusate sodium , fluticasone , and sertraline .  Meds ordered this encounter  Medications   famotidine  (PEPCID ) 10 MG tablet    Sig: Take 1 tablet (10 mg total) by mouth 2 (two) times daily as needed for heartburn or indigestion.    Supervising Provider:   DOMENICA BLACKBIRD A [phone]

## 2024-10-21 NOTE — Assessment & Plan Note (Signed)
 Reports PMS symptoms and anxiety stable/improved on sertraline .

## 2024-10-21 NOTE — Patient Instructions (Signed)
  VISIT SUMMARY: Today, you came in for a routine follow-up visit. Your recent sinusitis has resolved after treatment, and your right ring finger fracture is still healing but improving. Your anxiety and PMS are well-managed with sertraline , and your GERD is under control with occasional use of famotidine . You have also received your flu shot and completed the Hepatitis B vaccine series.  YOUR PLAN: -GENERALIZED ANXIETY DISORDER: Generalized anxiety disorder is a condition characterized by persistent and excessive worry about various aspects of life. Your anxiety is well-managed with sertraline  25 mg daily, and you are not experiencing any side effects. Please continue taking sertraline  25 mg orally every day.  -GASTRO-ESOPHAGEAL REFLUX DISEASE (GERD): GERD is a condition where stomach acid frequently flows back into the tube connecting your mouth and stomach, causing irritation. Your GERD is well-controlled with the rare use of famotidine . Please continue taking famotidine  20 mg orally as needed.  -GENERAL HEALTH MAINTENANCE: You have received your flu shot and completed the Hepatitis B vaccine series. Continue with your routine health maintenance, including regular check-ups and vaccinations as recommended.                        Contains text generated by [employer].

## 2024-10-21 NOTE — Assessment & Plan Note (Addendum)
 Lab Results  Component Value Date   TSH 2.40 04/01/2024   TFT's have been stable over the last 2 years.

## 2024-10-21 NOTE — Assessment & Plan Note (Signed)
 Stable with rare need for otc pepcid .

## 2024-10-21 NOTE — Assessment & Plan Note (Signed)
 Rare need for claritin and flonase .
